# Patient Record
Sex: Female | Born: 1960 | Race: Black or African American | Hispanic: No | Smoking: Never smoker
Health system: Southern US, Community
[De-identification: ages and names within clinical notes are randomized; demographics above are authoritative.]

## PROBLEM LIST (undated history)

## (undated) DIAGNOSIS — G4733 Obstructive sleep apnea (adult) (pediatric): Secondary | ICD-10-CM

## (undated) DIAGNOSIS — E782 Mixed hyperlipidemia: Secondary | ICD-10-CM

## (undated) DIAGNOSIS — I10 Essential (primary) hypertension: Secondary | ICD-10-CM

## (undated) DIAGNOSIS — I5032 Chronic diastolic (congestive) heart failure: Secondary | ICD-10-CM

## (undated) DIAGNOSIS — R0600 Dyspnea, unspecified: Secondary | ICD-10-CM

## (undated) DIAGNOSIS — I160 Hypertensive urgency: Secondary | ICD-10-CM

## (undated) DIAGNOSIS — I509 Heart failure, unspecified: Secondary | ICD-10-CM

## (undated) HISTORY — DX: Hypertensive urgency: I16.0

## (undated) HISTORY — DX: Obstructive sleep apnea (adult) (pediatric): G47.33

## (undated) HISTORY — DX: Mixed hyperlipidemia: E78.2

## (undated) HISTORY — PX: TUBAL LIGATION: SHX77

## (undated) HISTORY — PX: ABDOMINAL HYSTERECTOMY: SHX81

## (undated) HISTORY — DX: Chronic diastolic (congestive) heart failure: I50.32

## (undated) HISTORY — DX: Dyspnea, unspecified: R06.00

## (undated) HISTORY — PX: KNEE ARTHROSCOPY: SUR90

## (undated) HISTORY — PX: CHOLECYSTECTOMY: SHX55

---

## 1996-04-21 HISTORY — PX: ABDOMINAL HYSTERECTOMY: SHX81

## 2006-05-04 ENCOUNTER — Ambulatory Visit: Payer: Self-pay | Admitting: Family Medicine

## 2006-05-06 ENCOUNTER — Ambulatory Visit: Payer: Self-pay | Admitting: *Deleted

## 2006-05-10 ENCOUNTER — Emergency Department (HOSPITAL_COMMUNITY): Admission: EM | Admit: 2006-05-10 | Discharge: 2006-05-10 | Payer: Self-pay | Admitting: Emergency Medicine

## 2006-12-08 ENCOUNTER — Emergency Department (HOSPITAL_COMMUNITY): Admission: EM | Admit: 2006-12-08 | Discharge: 2006-12-08 | Payer: Self-pay | Admitting: Emergency Medicine

## 2006-12-10 ENCOUNTER — Ambulatory Visit: Payer: Self-pay | Admitting: Internal Medicine

## 2007-01-06 ENCOUNTER — Encounter (INDEPENDENT_AMBULATORY_CARE_PROVIDER_SITE_OTHER): Payer: Self-pay | Admitting: *Deleted

## 2007-01-28 ENCOUNTER — Emergency Department (HOSPITAL_COMMUNITY): Admission: EM | Admit: 2007-01-28 | Discharge: 2007-01-28 | Payer: Self-pay | Admitting: Emergency Medicine

## 2007-07-08 ENCOUNTER — Ambulatory Visit: Payer: Self-pay | Admitting: Internal Medicine

## 2007-07-08 LAB — CONVERTED CEMR LAB
Albumin: 4 g/dL (ref 3.5–5.2)
BUN: 13 mg/dL (ref 6–23)
Basophils Relative: 0 % (ref 0–1)
CO2: 23 meq/L (ref 19–32)
Calcium: 9 mg/dL (ref 8.4–10.5)
Chloride: 98 meq/L (ref 96–112)
Cholesterol: 185 mg/dL (ref 0–200)
Creatinine, Ser: 0.81 mg/dL (ref 0.40–1.20)
Glucose, Bld: 83 mg/dL (ref 70–99)
HDL: 46 mg/dL (ref >39)
Hemoglobin: 12.8 g/dL (ref 12.0–15.0)
Lymphocytes Relative: 19 % (ref 12–46)
Lymphs Abs: 1.9 10*3/uL (ref 0.7–4.0)
MCHC: 32 g/dL (ref 30.0–36.0)
Monocytes Absolute: 0.7 10*3/uL (ref 0.1–1.0)
Monocytes Relative: 7 % (ref 3–12)
Neutro Abs: 7 10*3/uL (ref 1.7–7.7)
RBC: 4.79 M/uL (ref 3.87–5.11)
Total CHOL/HDL Ratio: 4
Triglycerides: 74 mg/dL (ref <150)
WBC: 9.8 10*3/uL (ref 4.0–10.5)

## 2009-03-13 ENCOUNTER — Emergency Department (HOSPITAL_COMMUNITY): Admission: EM | Admit: 2009-03-13 | Discharge: 2009-03-13 | Payer: Self-pay | Admitting: Emergency Medicine

## 2010-06-05 ENCOUNTER — Emergency Department (HOSPITAL_COMMUNITY)
Admission: EM | Admit: 2010-06-05 | Discharge: 2010-06-06 | Disposition: A | Discharge status: Home / Self Care | Payer: No Typology Code available for payment source | Attending: Emergency Medicine | Admitting: Emergency Medicine

## 2010-06-05 DIAGNOSIS — I10 Essential (primary) hypertension: Secondary | ICD-10-CM | POA: Insufficient documentation

## 2010-06-05 DIAGNOSIS — S139XXA Sprain of joints and ligaments of unspecified parts of neck, initial encounter: Secondary | ICD-10-CM | POA: Insufficient documentation

## 2010-06-05 DIAGNOSIS — M542 Cervicalgia: Secondary | ICD-10-CM | POA: Insufficient documentation

## 2010-06-05 DIAGNOSIS — Z79899 Other long term (current) drug therapy: Secondary | ICD-10-CM | POA: Insufficient documentation

## 2010-07-28 ENCOUNTER — Emergency Department (HOSPITAL_COMMUNITY): Payer: Self-pay

## 2010-07-28 ENCOUNTER — Emergency Department (HOSPITAL_COMMUNITY)
Admission: EM | Admit: 2010-07-28 | Discharge: 2010-07-29 | Disposition: A | Discharge status: Home / Self Care | Payer: Self-pay | Attending: Emergency Medicine | Admitting: Emergency Medicine

## 2010-07-28 DIAGNOSIS — R51 Headache: Secondary | ICD-10-CM | POA: Insufficient documentation

## 2010-07-28 DIAGNOSIS — R42 Dizziness and giddiness: Secondary | ICD-10-CM | POA: Insufficient documentation

## 2010-07-28 DIAGNOSIS — R079 Chest pain, unspecified: Secondary | ICD-10-CM | POA: Insufficient documentation

## 2010-07-28 DIAGNOSIS — R21 Rash and other nonspecific skin eruption: Secondary | ICD-10-CM | POA: Insufficient documentation

## 2010-07-28 DIAGNOSIS — I10 Essential (primary) hypertension: Secondary | ICD-10-CM | POA: Insufficient documentation

## 2010-07-28 LAB — COMPREHENSIVE METABOLIC PANEL
ALT: 12 U/L (ref 0–35)
AST: 16 U/L (ref 0–37)
Albumin: 3.5 g/dL (ref 3.5–5.2)
Alkaline Phosphatase: 76 U/L (ref 39–117)
CO2: 29 mEq/L (ref 19–32)
Chloride: 99 mEq/L (ref 96–112)
Creatinine, Ser: 0.93 mg/dL (ref 0.4–1.2)
GFR calc Af Amer: >60 mL/min (ref >60)
GFR calc non Af Amer: >60 mL/min (ref >60)
Potassium: 3 mEq/L — ABNORMAL LOW (ref 3.5–5.1)
Sodium: 134 mEq/L — ABNORMAL LOW (ref 135–145)
Total Bilirubin: 0.3 mg/dL (ref 0.3–1.2)

## 2010-07-28 LAB — CBC
HCT: 36.3 % (ref 36.0–46.0)
MCHC: 33.6 g/dL (ref 30.0–36.0)
MCV: 81 fL (ref 78.0–100.0)
Platelets: 288 10*3/uL (ref 150–400)
RDW: 13.7 % (ref 11.5–15.5)
WBC: 13.1 10*3/uL — ABNORMAL HIGH (ref 4.0–10.5)

## 2010-07-28 LAB — DIFFERENTIAL
Eosinophils Absolute: 0.2 10*3/uL (ref 0.0–0.7)
Eosinophils Relative: 2 % (ref 0–5)
Lymphocytes Relative: 25 % (ref 12–46)
Lymphs Abs: 3.3 10*3/uL (ref 0.7–4.0)
Monocytes Absolute: 1 10*3/uL (ref 0.1–1.0)

## 2010-07-28 LAB — URINALYSIS, ROUTINE W REFLEX MICROSCOPIC
Bilirubin Urine: NEGATIVE
Hgb urine dipstick: NEGATIVE
Specific Gravity, Urine: 1.019 (ref 1.005–1.030)
pH: 5.5 (ref 5.0–8.0)

## 2010-07-28 LAB — POCT I-STAT, CHEM 8
Calcium, Ion: 0.99 mmol/L — ABNORMAL LOW (ref 1.12–1.32)
Chloride: 100 mEq/L (ref 96–112)
Glucose, Bld: 99 mg/dL (ref 70–99)
HCT: 38 % (ref 36.0–46.0)
Hemoglobin: 12.9 g/dL (ref 12.0–15.0)
TCO2: 28 mmol/L (ref 0–100)

## 2010-07-28 LAB — APTT: aPTT: 26 seconds (ref 24–37)

## 2010-07-28 LAB — RAPID URINE DRUG SCREEN, HOSP PERFORMED
Amphetamines: NOT DETECTED
Barbiturates: NOT DETECTED
Cocaine: NOT DETECTED
Opiates: NOT DETECTED
Tetrahydrocannabinol: NOT DETECTED

## 2010-07-28 LAB — POCT CARDIAC MARKERS: Troponin i, poc: <0.05 ng/mL (ref 0.00–0.09)

## 2010-10-18 ENCOUNTER — Emergency Department (HOSPITAL_COMMUNITY)
Admission: EM | Admit: 2010-10-18 | Discharge: 2010-10-19 | Disposition: A | Discharge status: Home / Self Care | Payer: Self-pay | Attending: Emergency Medicine | Admitting: Emergency Medicine

## 2010-10-18 DIAGNOSIS — I10 Essential (primary) hypertension: Secondary | ICD-10-CM | POA: Insufficient documentation

## 2010-10-18 DIAGNOSIS — T63391A Toxic effect of venom of other spider, accidental (unintentional), initial encounter: Secondary | ICD-10-CM | POA: Insufficient documentation

## 2010-10-18 DIAGNOSIS — T6391XA Toxic effect of contact with unspecified venomous animal, accidental (unintentional), initial encounter: Secondary | ICD-10-CM | POA: Insufficient documentation

## 2010-10-18 DIAGNOSIS — L02419 Cutaneous abscess of limb, unspecified: Secondary | ICD-10-CM | POA: Insufficient documentation

## 2010-10-18 DIAGNOSIS — M7989 Other specified soft tissue disorders: Secondary | ICD-10-CM | POA: Insufficient documentation

## 2010-10-18 DIAGNOSIS — M79609 Pain in unspecified limb: Secondary | ICD-10-CM | POA: Insufficient documentation

## 2010-10-18 DIAGNOSIS — Z79899 Other long term (current) drug therapy: Secondary | ICD-10-CM | POA: Insufficient documentation

## 2011-01-31 LAB — I-STAT 8, (EC8 V) (CONVERTED LAB)
BUN: 7
Bicarbonate: 29.2 — ABNORMAL HIGH
Glucose, Bld: 101 — ABNORMAL HIGH
Hemoglobin: 13.6
Sodium: 138
TCO2: 30
pH, Ven: 7.432 — ABNORMAL HIGH

## 2011-01-31 LAB — POCT CARDIAC MARKERS
Myoglobin, poc: 56.7
Operator id: 285841
Troponin i, poc: <0.05

## 2011-01-31 LAB — POCT I-STAT CREATININE: Creatinine, Ser: 1.1

## 2011-03-28 ENCOUNTER — Encounter: Payer: Self-pay | Admitting: *Deleted

## 2011-03-28 ENCOUNTER — Other Ambulatory Visit: Payer: Self-pay

## 2011-03-28 ENCOUNTER — Emergency Department (HOSPITAL_COMMUNITY)
Admission: EM | Admit: 2011-03-28 | Discharge: 2011-03-28 | Disposition: A | Discharge status: Home / Self Care | Payer: Self-pay | Attending: Emergency Medicine | Admitting: Emergency Medicine

## 2011-03-28 ENCOUNTER — Emergency Department (HOSPITAL_COMMUNITY): Payer: Self-pay

## 2011-03-28 DIAGNOSIS — J3489 Other specified disorders of nose and nasal sinuses: Secondary | ICD-10-CM | POA: Insufficient documentation

## 2011-03-28 DIAGNOSIS — B9789 Other viral agents as the cause of diseases classified elsewhere: Secondary | ICD-10-CM | POA: Insufficient documentation

## 2011-03-28 DIAGNOSIS — R5381 Other malaise: Secondary | ICD-10-CM | POA: Insufficient documentation

## 2011-03-28 DIAGNOSIS — IMO0001 Reserved for inherently not codable concepts without codable children: Secondary | ICD-10-CM | POA: Insufficient documentation

## 2011-03-28 DIAGNOSIS — R509 Fever, unspecified: Secondary | ICD-10-CM | POA: Insufficient documentation

## 2011-03-28 DIAGNOSIS — R059 Cough, unspecified: Secondary | ICD-10-CM | POA: Insufficient documentation

## 2011-03-28 DIAGNOSIS — B349 Viral infection, unspecified: Secondary | ICD-10-CM

## 2011-03-28 DIAGNOSIS — R197 Diarrhea, unspecified: Secondary | ICD-10-CM | POA: Insufficient documentation

## 2011-03-28 DIAGNOSIS — J029 Acute pharyngitis, unspecified: Secondary | ICD-10-CM | POA: Insufficient documentation

## 2011-03-28 DIAGNOSIS — I10 Essential (primary) hypertension: Secondary | ICD-10-CM | POA: Insufficient documentation

## 2011-03-28 DIAGNOSIS — R112 Nausea with vomiting, unspecified: Secondary | ICD-10-CM | POA: Insufficient documentation

## 2011-03-28 DIAGNOSIS — R6889 Other general symptoms and signs: Secondary | ICD-10-CM | POA: Insufficient documentation

## 2011-03-28 DIAGNOSIS — R05 Cough: Secondary | ICD-10-CM | POA: Insufficient documentation

## 2011-03-28 HISTORY — DX: Essential (primary) hypertension: I10

## 2011-03-28 LAB — URINALYSIS, DIPSTICK ONLY
Glucose, UA: NEGATIVE mg/dL
Hgb urine dipstick: NEGATIVE
Protein, ur: NEGATIVE mg/dL
Specific Gravity, Urine: 1.015 (ref 1.005–1.030)
pH: 7 (ref 5.0–8.0)

## 2011-03-28 LAB — POCT I-STAT, CHEM 8
Chloride: 103 mEq/L (ref 96–112)
Glucose, Bld: 95 mg/dL (ref 70–99)
HCT: 42 % (ref 36.0–46.0)
Potassium: 3.5 mEq/L (ref 3.5–5.1)
Sodium: 139 mEq/L (ref 135–145)

## 2011-03-28 LAB — RAPID STREP SCREEN (MED CTR MEBANE ONLY): Streptococcus, Group A Screen (Direct): NEGATIVE

## 2011-03-28 MED ORDER — IBUPROFEN 200 MG PO TABS
400.0000 mg | ORAL_TABLET | Freq: Once | ORAL | Status: AC
  Administered 2011-03-28: 400 mg via ORAL
  Filled 2011-03-28: qty 2

## 2011-03-28 MED ORDER — HYDROCHLOROTHIAZIDE 12.5 MG PO CAPS
25.0000 mg | ORAL_CAPSULE | Freq: Once | ORAL | Status: AC
  Administered 2011-03-28: 25 mg via ORAL
  Filled 2011-03-28 (×2): qty 2

## 2011-03-28 MED ORDER — ONDANSETRON HCL 4 MG/2ML IJ SOLN
4.0000 mg | Freq: Once | INTRAMUSCULAR | Status: AC
  Administered 2011-03-28: 4 mg via INTRAVENOUS
  Filled 2011-03-28: qty 2

## 2011-03-28 MED ORDER — SODIUM CHLORIDE 0.9 % IV BOLUS (SEPSIS)
500.0000 mL | Freq: Once | INTRAVENOUS | Status: AC
  Administered 2011-03-28: 500 mL via INTRAVENOUS

## 2011-03-28 MED ORDER — ACETAMINOPHEN 325 MG PO TABS
ORAL_TABLET | ORAL | Status: AC
  Filled 2011-03-28: qty 3

## 2011-03-28 MED ORDER — ACETAMINOPHEN 500 MG PO TABS
1000.0000 mg | ORAL_TABLET | Freq: Once | ORAL | Status: AC
  Administered 2011-03-28: 975 mg via ORAL

## 2011-03-28 MED ORDER — HYDROCHLOROTHIAZIDE 25 MG PO TABS: 25.0000 mg | ORAL_TABLET | Freq: Every day | ORAL | Status: DC

## 2011-03-28 MED ORDER — SODIUM CHLORIDE 0.9 % IV SOLN: INTRAVENOUS | Status: DC

## 2011-03-28 NOTE — ED Notes (Signed)
EKG was performed in triage by Raynelle Fanning, RN and Clarene Duke, MD has EKGs

## 2011-03-28 NOTE — ED Notes (Signed)
Patient reports onset of fever, chills, and diarrhea on yesterday.  She states today she is aching all over

## 2011-03-28 NOTE — ED Notes (Signed)
HCTZ still needed from pharmacy. They are aware and 'will look at it'.

## 2011-03-28 NOTE — ED Notes (Signed)
Fever, nausea, body aches and cough that started yesterday.  Pt rates pain 10/10.

## 2011-03-28 NOTE — ED Provider Notes (Signed)
History     CSN: [id number] Arrival date & time: 03/28/2011 12:41 PM    Chief Complaint  Patient presents with  . Fever  . Chills  . Diarrhea  . Emesis    HPI Pt was seen at 1540.  Per pt, c/o gradual onset and persistence of constant runny/stuffy nose, sinus congestion, sore throat, cough, nausea, several "loose" stools, and generalized body aches/fatigue since yesterday.  +several others in household with same symptoms.  Denies fevers, no SOB, no CP/palpitations, no abd pain, no vomiting, no black or blood in stools, no rash.    Past Medical History  Diagnosis Date  . Hypertension     Past Surgical History  Procedure Date  . Cholecystectomy   . Abdominal hysterectomy   . Tubal ligation    History  Substance Use Topics  . Smoking status: Never Smoker   . Smokeless tobacco: Not on file  . Alcohol Use: No   Review of Systems ROS: Statement: All systems negative except as marked or noted in the HPI; Constitutional: Negative for fever and chills. +generalized body aches/fatigue ; ; Eyes: Negative for eye pain, redness and discharge. ; ; ENMT: Negative for ear pain, hoarseness, +nasal congestion, sinus pressure and sore throat. ; ; Cardiovascular: Negative for chest pain, palpitations, diaphoresis, dyspnea and peripheral edema. ; ; Respiratory: +cough. Negative for wheezing and stridor. ; ; Gastrointestinal: +nausea, diarrhea.  Negative for vomiting, abdominal pain, blood in stool, hematemesis, jaundice and rectal bleeding. . ; ; Genitourinary: Negative for dysuria, flank pain and hematuria. ; ; Musculoskeletal: Negative for back pain and neck pain. Negative for swelling and trauma.; ; Skin: Negative for pruritus, rash, abrasions, blisters, bruising and skin lesion.; ; Neuro: Negative for headache, lightheadedness and neck stiffness. Negative for altered level of consciousness , altered mental status, extremity weakness, paresthesias, involuntary movement, seizure and syncope.      Allergies  Review of patient's allergies indicates no known allergies.  Home Medications   Current Outpatient Rx  Name Route Sig Dispense Refill  . GUAIFENESIN ER 600 MG PO TB12 Oral Take 600 mg by mouth every 6 (six) hours as needed. For cough/congestion       BP 182/103  Pulse 92  Temp(Src) 98.9 F (37.2 C) (Oral)  Resp 18  SpO2 97%  Physical Exam 1545: Physical examination:  Nursing notes reviewed; Vital signs and O2 SAT reviewed;  Constitutional: Well developed, Well nourished, Well hydrated, In no acute distress; Head:  Normocephalic, atraumatic; Eyes: EOMI, PERRL, No scleral icterus; ENMT: Mouth and pharynx normal, Mucous membranes moist; Neck: Supple, Full range of motion, No lymphadenopathy; Cardiovascular: Regular rate and rhythm, No murmur, rub, or gallop; Respiratory: Breath sounds clear & equal bilaterally, No rales, rhonchi, wheezes, or rub, Normal respiratory effort/excursion; Chest: Nontender, Movement normal; Abdomen: Soft, Nontender, Nondistended, Normal bowel sounds; Genitourinary: No CVA tenderness; Extremities: Pulses normal, No tenderness, No edema, No calf edema or asymmetry.; Neuro: AA&Ox3, Major CN grossly intact. No facial droop, speech clear.  No gross focal motor or sensory deficits in extremities.; Skin: Color normal, Warm, Dry, no rash, no petechiae.     ED Course  Procedures   1545:  Pt also states she has been out of her anti-HTN meds "for a while now."  Inclu atenolol, lisinopril, HCTZ.  States she was planning on calling [facility] this week to re-establish with a PMD.  Long d/w pt re: importance of HTN management and establishing with PMD.  Verb understanding.  Will rx HCTZ, will give  1st dose while in ED.    MDM  MDM Reviewed: nursing note and vitals Reviewed previous: ECG Interpretation: ECG, labs and x-ray    Date: 03/28/2011  Rate: 92  Rhythm: normal sinus rhythm  QRS Axis: normal  Intervals: normal  ST/T Wave abnormalities:  nonspecific T wave changes  Conduction Disutrbances:none  Narrative Interpretation:   Old EKG Reviewed: unchanged; NS T-wave changes on today's EKG improved from previous EKG dated 09/27/2010.  Results for orders placed during the hospital encounter of 03/28/11  POCT I-STAT, CHEM 8      Component Value Range   Sodium 139  135 - 145 (mEq/L)   Potassium 3.5  3.5 - 5.1 (mEq/L)   Chloride 103  96 - 112 (mEq/L)   BUN 4 (*) 6 - 23 (mg/dL)   Creatinine, Ser 7.82  0.50 - 1.10 (mg/dL)   Glucose, Bld 95  70 - 99 (mg/dL)   Calcium, Ion 9.56 (*) 1.12 - 1.32 (mmol/L)   TCO2 24  0 - 100 (mmol/L)   Hemoglobin 14.3  12.0 - 15.0 (g/dL)   HCT 21.3  08.6 - 57.8 (%)  URINALYSIS, DIPSTICK ONLY      Component Value Range   Specific Gravity, Urine 1.015  1.005 - 1.030    pH 7.0  5.0 - 8.0    Glucose, UA NEGATIVE  NEGATIVE (mg/dL)   Hgb urine dipstick NEGATIVE  NEGATIVE    Bilirubin Urine NEGATIVE  NEGATIVE    Ketones, ur NEGATIVE  NEGATIVE (mg/dL)   Protein, ur NEGATIVE  NEGATIVE (mg/dL)   Urobilinogen, UA 0.2  0.0 - 1.0 (mg/dL)   Nitrite NEGATIVE  NEGATIVE    Leukocytes, UA NEGATIVE  NEGATIVE   RAPID STREP SCREEN      Component Value Range   Streptococcus, Group A Screen (Direct) NEGATIVE  NEGATIVE    Dg Chest 2 View  03/28/2011  *RADIOLOGY REPORT*  Clinical Data: Rule out infiltrate.  Nausea, vomiting with fever, congestion, coughing, body aches and weakness.  Hypertension. Nonsmoker  CHEST - 2 VIEW  Comparison: 07/28/2010  Findings: Heart and mediastinal contours are within normal limits. Ectasia of the thoracic aorta is again identified and is stable in degree.  This would correlate with the history of hypertension.  The lung fields are clear with no signs of focal infiltrate or congestive failure.  No pleural fluid or peribronchial cuffing is noted.  Bony structures appear intact.  IMPRESSION: Stable cardiopulmonary appearance with no worrisome focal or acute abnormality seen.  Original Report  Authenticated By: Bertha Stakes, M.D.    6:13 PM:  Feels "better" after tylenol and ibuprofen.  No fevers while in ED.  No focal source of infection; likely viral illness at this time.  EKG, Cr, UA normal.  Denies CP or focal motor weakness.  Wants to go home now.  Dx testing d/w pt.  Questions answered.  Verb understanding, agreeable to d/c home with outpt f/u.            Ziaire Bieser Allison Quarry, DO 03/30/11 0231

## 2011-03-28 NOTE — ED Provider Notes (Signed)
  Physical Exam  BP 198/123  Pulse 102  Temp(Src) 98.8 F (37.1 C) (Oral)  Resp 20  SpO2 96%  Physical Exam Uncomfortable appearing. Hypertensive.  ED Course  Procedures  MDM Hypertensive with medical noncompliance for a month. She comes in because of diarrhea and myalgias. She is sick contact with a grandson. We'll get some basic labs IV fluids and Zofran.      Juliet Rude. Rubin Payor, MD 03/28/11 1423

## 2014-03-07 ENCOUNTER — Emergency Department (HOSPITAL_COMMUNITY)
Admission: EM | Admit: 2014-03-07 | Discharge: 2014-03-07 | Disposition: A | Discharge status: Home / Self Care | Payer: No Typology Code available for payment source | Attending: Emergency Medicine | Admitting: Emergency Medicine

## 2014-03-07 ENCOUNTER — Encounter (HOSPITAL_COMMUNITY): Payer: Self-pay

## 2014-03-07 DIAGNOSIS — M25561 Pain in right knee: Secondary | ICD-10-CM | POA: Insufficient documentation

## 2014-03-07 DIAGNOSIS — R609 Edema, unspecified: Secondary | ICD-10-CM

## 2014-03-07 DIAGNOSIS — I10 Essential (primary) hypertension: Secondary | ICD-10-CM | POA: Insufficient documentation

## 2014-03-07 DIAGNOSIS — M1990 Unspecified osteoarthritis, unspecified site: Secondary | ICD-10-CM | POA: Insufficient documentation

## 2014-03-07 LAB — BASIC METABOLIC PANEL
Anion gap: 12 (ref 5–15)
BUN: 12 mg/dL (ref 6–23)
CALCIUM: 8.5 mg/dL (ref 8.4–10.5)
CO2: 27 mEq/L (ref 19–32)
CREATININE: 0.88 mg/dL (ref 0.50–1.10)
Chloride: 101 mEq/L (ref 96–112)
GFR calc Af Amer: 87 mL/min — ABNORMAL LOW (ref >90)
GFR, EST NON AFRICAN AMERICAN: 75 mL/min — AB (ref >90)
GLUCOSE: 88 mg/dL (ref 70–99)
Potassium: 3.7 mEq/L (ref 3.7–5.3)
Sodium: 140 mEq/L (ref 137–147)

## 2014-03-07 MED ORDER — LISINOPRIL-HYDROCHLOROTHIAZIDE 20-12.5 MG PO TABS: 1.0000 | ORAL_TABLET | Freq: Every day | ORAL | Status: DC

## 2014-03-07 MED ORDER — MELOXICAM 15 MG PO TABS: 15.0000 mg | ORAL_TABLET | Freq: Every day | ORAL | Status: DC

## 2014-03-07 NOTE — ED Notes (Signed)
states she took motrin 400 mg this am

## 2014-03-07 NOTE — ED Notes (Signed)
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 C/o pain and swelling to right leg and knee today. Increased pain

## 2014-03-07 NOTE — Discharge Instructions (Signed)
Arthritis, Nonspecific Arthritis is pain, redness, warmth, or puffiness (inflammation) of a joint. The joint may be stiff or hurt when you move it. One or more joints may be affected. There are many types of arthritis. Your doctor may not know what type you have right away. The most common cause of arthritis is wear and tear on the joint (osteoarthritis). HOME CARE   Only take medicine as told by your doctor.  Rest the joint as much as possible.  Raise (elevate) your joint if it is puffy.  Use crutches if the painful joint is in your leg.  Drink enough fluids to keep your pee (urine) clear or pale yellow.  Follow your doctor's diet instructions.  Use cold packs for very bad joint pain for 10 to 15 minutes every hour. Ask your doctor if it is okay for you to use hot packs.  Exercise as told by your doctor.  Take a warm shower if you have stiffness in the morning.  Move your sore joints throughout the day. GET HELP RIGHT AWAY IF:   You have a fever.  You have very bad joint pain, puffiness, or redness.  You have many joints that are painful and puffy.  You are not getting better with treatment.  You have very bad back pain or leg weakness.  You cannot control when you poop (bowel movement) or pee (urinate).  You do not feel better in 24 hours or are getting worse.  You are having side effects from your medicine. MAKE SURE YOU:   Understand these instructions.  Will watch your condition.  Will get help right away if you are not doing well or get worse. Document Released: 07/02/2009 Document Revised: 10/07/2011 Document Reviewed: 07/02/2009 [facility]ExitCare Patient Information 2015 [place], [place]. This information is not intended to replace advice given to you by your health care provider. Make sure you discuss any questions you have with your health care provider.  Edema Edema is an abnormal buildup of fluids in your bodytissues. Edema is somewhatdependent on gravity to  pull the fluid to the lowest place in your body. That makes the condition more common in the legs and thighs (lower extremities). Painless swelling of the feet and ankles is common and becomes more likely as you get older. It is also common in looser tissues, like around your eyes.  When the affected area is squeezed, the fluid may move out of that spot and leave a dent for a few moments. This dent is called pitting.  CAUSES  There are many possible causes of edema. Eating too much salt and being on your feet or sitting for a long time can cause edema in your legs and ankles. Hot weather may make edema worse. Common medical causes of edema include:  Heart failure.  Liver disease.  Kidney disease.  Weak blood vessels in your legs.  Cancer.  An injury.  Pregnancy.  Some medications.  Obesity. SYMPTOMS  Edema is usually painless.Your skin may look swollen or shiny.  DIAGNOSIS  Your health care provider may be able to diagnose edema by asking about your medical history and doing a physical exam. You may need to have tests such as X-rays, an electrocardiogram, or blood tests to check for medical conditions that may cause edema.  TREATMENT  Edema treatment depends on the cause. If you have heart, liver, or kidney disease, you need the treatment appropriate for these conditions. General treatment may include:  Elevation of the affected body part above the  level of your heart.  Compression of the affected body part. Pressure from elastic bandages or support stockings squeezes the tissues and forces fluid back into the blood vessels. This keeps fluid from entering the tissues.  Restriction of fluid and salt intake.  Use of a water pill (diuretic). These medications are appropriate only for some types of edema. They pull fluid out of your body and make you urinate more often. This gets rid of fluid and reduces swelling, but diuretics can have side effects. Only use diuretics as directed by  your health care provider. HOME CARE INSTRUCTIONS   Keep the affected body part above the level of your heart when you are lying down.   Do not sit still or stand for prolonged periods.   Do not put anything directly under your knees when lying down.  Do not wear constricting clothing or garters on your upper legs.   Exercise your legs to work the fluid back into your blood vessels. This may help the swelling go down.   Wear elastic bandages or support stockings to reduce ankle swelling as directed by your health care provider.   Eat a low-salt diet to reduce fluid if your health care provider recommends it.   Only take medicines as directed by your health care provider. SEEK MEDICAL CARE IF:   Your edema is not responding to treatment.  You have heart, liver, or kidney disease and notice symptoms of edema.  You have edema in your legs that does not improve after elevating them.   You have sudden and unexplained weight gain. SEEK IMMEDIATE MEDICAL CARE IF:   You develop shortness of breath or chest pain.   You cannot breathe when you lie down.  You develop pain, redness, or warmth in the swollen areas.   You have heart, liver, or kidney disease and suddenly get edema.  You have a fever and your symptoms suddenly get worse. MAKE SURE YOU:   Understand these instructions.  Will watch your condition.  Will get help right away if you are not doing well or get worse. Document Released: 04/07/2005 Document Revised: 08/22/2013 Document Reviewed: 01/28/2013 [facility] Patient Information 2015 [place], [place]. This information is not intended to replace advice given to you by your health care provider. Make sure you discuss any questions you have with your health care provider.

## 2014-03-07 NOTE — ED Notes (Signed)
MD at bedside. 

## 2014-03-07 NOTE — ED Notes (Signed)
Pt reports she began having LLE swelling followed by R knee pain and RLE swelling.  Pt is alert and oriented.  No redness noted to LE.  LLE > RLE swelling.

## 2014-03-07 NOTE — ED Provider Notes (Signed)
CSN: [id number]     Arrival date & time 03/07/14  0901 History   First MD Initiated Contact with Patient 03/07/14 [phone]     Chief Complaint  Patient presents with  . Leg Swelling  . Knee Pain      HPI  She presents evaluation of bilateral lower extremity swelling, and right knee pain. She had arthroscopic surgery on her knee several years ago. Pain is persisted. She states she has been a great of within the last several years as well. No cough dyspnea or CHF symptoms. History of lower extremity edema and hypertension. Was previously on Zestoretic. Has been off of his for several months.  Past Medical History  Diagnosis Date  . Hypertension    Past Surgical History  Procedure Laterality Date  . Cholecystectomy    . Abdominal hysterectomy    . Tubal ligation    . Knee arthroscopy     No family history on file. History  Substance Use Topics  . Smoking status: Never Smoker   . Smokeless tobacco: Not on file  . Alcohol Use: No   OB History    No data available     Review of Systems  Constitutional: Negative for fever, chills, diaphoresis, appetite change and fatigue.  HENT: Negative for congestion, mouth sores, sinus pressure, sore throat and trouble swallowing.   Eyes: Negative for visual disturbance.  Respiratory: Negative for cough, chest tightness, shortness of breath and wheezing.   Cardiovascular: Positive for leg swelling. Negative for chest pain.  Gastrointestinal: Negative for nausea, vomiting, abdominal pain, diarrhea and abdominal distention.  Endocrine: Negative for polydipsia, polyphagia and polyuria.  Genitourinary: Negative for dysuria, frequency and hematuria.  Musculoskeletal: Positive for arthralgias. Negative for gait problem.  Skin: Negative for color change, pallor and rash.  Neurological: Negative for dizziness, syncope, light-headedness and headaches.  Hematological: Does not bruise/bleed easily.  Psychiatric/Behavioral: Negative for behavioral  problems and confusion.      Allergies  Percocet  Home Medications   Prior to Admission medications   Medication Sig Start Date End Date Taking? Authorizing Provider  acetaminophen (TYLENOL) 500 MG tablet Take 1,000 mg by mouth every 8 (eight) hours as needed (pain).   Yes Historical Provider, MD  ibuprofen (ADVIL,MOTRIN) 200 MG tablet Take 400 mg by mouth every 6 (six) hours as needed (pain).   Yes Historical Provider, MD  lisinopril-hydrochlorothiazide (PRINZIDE) 20-12.5 MG per tablet Take 1 tablet by mouth daily. 03/07/14   Rolland PorterMark Adib Wahba, MD  meloxicam (MOBIC) 15 MG tablet Take 1 tablet (15 mg total) by mouth daily. 03/07/14   Rolland PorterMark Shernell Saldierna, MD   BP 175/98 mmHg  Pulse 83  Resp 16  Ht 5\' 2"  (1.575 m)  Wt 270 lb (122.471 kg)  BMI 49.37 kg/m2  SpO2 97% Physical Exam  Constitutional: She is oriented to person, place, and time. She appears well-developed and well-nourished. No distress.  HENT:  Head: Normocephalic.  Nasal congestion.  Normal pharynx.  Normal TMs  Eyes: Conjunctivae are normal. Pupils are equal, round, and reactive to light. No scleral icterus.  Neck: Normal range of motion. Neck supple. No thyromegaly present.  Cardiovascular: Normal rate and regular rhythm.  Exam reveals no gallop and no friction rub.   No murmur heard. Pulmonary/Chest: Effort normal and breath sounds normal. No respiratory distress. She has no wheezes. She has no rales.  Abdominal: Soft. Bowel sounds are normal. She exhibits no distension. There is no tenderness. There is no rebound.  Musculoskeletal: Normal range of motion.  Legs: He has trace symmetric bilateral lower extremity edema.  Neurological: She is alert and oriented to person, place, and time.  Skin: Skin is warm and dry. No rash noted.  Psychiatric: She has a normal mood and affect. Her behavior is normal.    ED Course  Procedures (including critical care time) Labs Review Labs Reviewed  BASIC METABOLIC PANEL - Abnormal;  Notable for the following:    GFR calc non Af Amer 75 (*)    GFR calc Af Amer 87 (*)    All other components within normal limits    Imaging Review No results found.   EKG Interpretation None      MDM   Final diagnoses:  Arthritis  Dependent edema  Essential hypertension    Renal function preserved. . Chronic right knee pain and swelling. Recurrence of lower extremity edema after discontinuing diuretic. Discussed weight control and weight loss, diet, salt resection, and exercise.  Prescription for 30 days of Zestoretic, and mobility. Primary care follow-up.    Rolland PorterMark Hyden Soley, MD 03/07/14 [phone]

## 2014-03-11 ENCOUNTER — Emergency Department (HOSPITAL_COMMUNITY): Payer: Self-pay

## 2014-03-11 ENCOUNTER — Encounter (HOSPITAL_COMMUNITY): Payer: Self-pay | Admitting: Emergency Medicine

## 2014-03-11 ENCOUNTER — Emergency Department (HOSPITAL_COMMUNITY)
Admission: EM | Admit: 2014-03-11 | Discharge: 2014-03-12 | Disposition: A | Discharge status: Home / Self Care | Payer: Self-pay | Attending: Emergency Medicine | Admitting: Emergency Medicine

## 2014-03-11 DIAGNOSIS — R1110 Vomiting, unspecified: Secondary | ICD-10-CM

## 2014-03-11 DIAGNOSIS — R000 Tachycardia, unspecified: Secondary | ICD-10-CM | POA: Insufficient documentation

## 2014-03-11 DIAGNOSIS — I10 Essential (primary) hypertension: Secondary | ICD-10-CM | POA: Insufficient documentation

## 2014-03-11 DIAGNOSIS — R197 Diarrhea, unspecified: Secondary | ICD-10-CM | POA: Insufficient documentation

## 2014-03-11 DIAGNOSIS — Z79899 Other long term (current) drug therapy: Secondary | ICD-10-CM | POA: Insufficient documentation

## 2014-03-11 LAB — COMPREHENSIVE METABOLIC PANEL
ALBUMIN: 4.1 g/dL (ref 3.5–5.2)
ALK PHOS: 101 U/L (ref 39–117)
ALT: 35 U/L (ref 0–35)
ANION GAP: 15 (ref 5–15)
AST: 24 U/L (ref 0–37)
BILIRUBIN TOTAL: 0.8 mg/dL (ref 0.3–1.2)
BUN: 11 mg/dL (ref 6–23)
CHLORIDE: 96 meq/L (ref 96–112)
CO2: 26 meq/L (ref 19–32)
CREATININE: 0.82 mg/dL (ref 0.50–1.10)
Calcium: 9.3 mg/dL (ref 8.4–10.5)
GFR calc Af Amer: >90 mL/min (ref >90)
GFR, EST NON AFRICAN AMERICAN: 82 mL/min — AB (ref >90)
Glucose, Bld: 129 mg/dL — ABNORMAL HIGH (ref 70–99)
POTASSIUM: 3.4 meq/L — AB (ref 3.7–5.3)
Sodium: 137 mEq/L (ref 137–147)
Total Protein: 9.1 g/dL — ABNORMAL HIGH (ref 6.0–8.3)

## 2014-03-11 LAB — CBC WITH DIFFERENTIAL/PLATELET
BASOS PCT: 0 % (ref 0–1)
Basophils Absolute: 0 10*3/uL (ref 0.0–0.1)
Eosinophils Absolute: 0 10*3/uL (ref 0.0–0.7)
Eosinophils Relative: 0 % (ref 0–5)
HCT: 39.3 % (ref 36.0–46.0)
Hemoglobin: 12.9 g/dL (ref 12.0–15.0)
LYMPHS PCT: 4 % — AB (ref 12–46)
Lymphs Abs: 0.6 10*3/uL — ABNORMAL LOW (ref 0.7–4.0)
MCH: 26 pg (ref 26.0–34.0)
MCHC: 32.8 g/dL (ref 30.0–36.0)
MCV: 79.2 fL (ref 78.0–100.0)
MONO ABS: 0.7 10*3/uL (ref 0.1–1.0)
MONOS PCT: 4 % (ref 3–12)
NEUTROS ABS: 14.2 10*3/uL — AB (ref 1.7–7.7)
Neutrophils Relative %: 92 % — ABNORMAL HIGH (ref 43–77)
PLATELETS: 314 10*3/uL (ref 150–400)
RBC: 4.96 MIL/uL (ref 3.87–5.11)
RDW: 13.8 % (ref 11.5–15.5)
WBC: 15.4 10*3/uL — ABNORMAL HIGH (ref 4.0–10.5)

## 2014-03-11 LAB — LIPASE, BLOOD: Lipase: 17 U/L (ref 11–59)

## 2014-03-11 LAB — I-STAT TROPONIN, ED: Troponin i, poc: 0 ng/mL (ref 0.00–0.08)

## 2014-03-11 MED ORDER — METOCLOPRAMIDE HCL 5 MG/ML IJ SOLN
10.0000 mg | Freq: Once | INTRAMUSCULAR | Status: AC
  Administered 2014-03-11: 10 mg via INTRAVENOUS
  Filled 2014-03-11: qty 2

## 2014-03-11 MED ORDER — SODIUM CHLORIDE 0.9 % IV BOLUS (SEPSIS)
1000.0000 mL | Freq: Once | INTRAVENOUS | Status: AC
  Administered 2014-03-11: 1000 mL via INTRAVENOUS

## 2014-03-11 MED ORDER — ACETAMINOPHEN 500 MG PO TABS
1000.0000 mg | ORAL_TABLET | Freq: Once | ORAL | Status: AC
  Administered 2014-03-11: 1000 mg via ORAL
  Filled 2014-03-11: qty 2

## 2014-03-11 MED ORDER — GI COCKTAIL ~~LOC~~
30.0000 mL | Freq: Once | ORAL | Status: AC
  Administered 2014-03-11: 30 mL via ORAL
  Filled 2014-03-11: qty 30

## 2014-03-11 MED ORDER — ONDANSETRON 4 MG PO TBDP
8.0000 mg | ORAL_TABLET | Freq: Once | ORAL | Status: AC
  Administered 2014-03-11: 8 mg via ORAL
  Filled 2014-03-11: qty 2

## 2014-03-11 MED ORDER — ONDANSETRON HCL 4 MG/2ML IJ SOLN
4.0000 mg | Freq: Once | INTRAMUSCULAR | Status: AC
  Administered 2014-03-11: 4 mg via INTRAVENOUS

## 2014-03-11 NOTE — ED Provider Notes (Signed)
CSN: [id number]     Arrival date & time 03/11/14  1703 History   First MD Initiated Contact with Patient 03/11/14 1848     Chief Complaint  Patient presents with  . Emesis  . Diarrhea     (Consider location/radiation/quality/duration/timing/severity/associated sxs/prior Treatment) Patient is a 53 y.o. female presenting with vomiting. The history is provided by the patient.  Emesis Severity:  Moderate Timing:  Constant Progression:  Unchanged Chronicity:  New Recent urination:  Normal Relieved by:  Nothing Worsened by:  Nothing tried Associated symptoms: abdominal pain (epigastric) and diarrhea   Associated symptoms: no chills, no cough, no fever, no sore throat and no URI     Past Medical History  Diagnosis Date  . Hypertension    Past Surgical History  Procedure Laterality Date  . Cholecystectomy    . Abdominal hysterectomy    . Tubal ligation    . Knee arthroscopy     History reviewed. No pertinent family history. History  Substance Use Topics  . Smoking status: Never Smoker   . Smokeless tobacco: Not on file  . Alcohol Use: No   OB History    No data available     Review of Systems  Constitutional: Negative for fever and chills.  HENT: Negative for sore throat.   Respiratory: Negative for shortness of breath.   Cardiovascular: Positive for chest pain (pressure, in epigastrum).  Gastrointestinal: Positive for vomiting, abdominal pain (epigastric) and diarrhea.  All other systems reviewed and are negative.     Allergies  Percocet  Home Medications   Prior to Admission medications   Medication Sig Start Date End Date Taking? Authorizing Provider  acetaminophen (TYLENOL) 500 MG tablet Take 1,000 mg by mouth every 8 (eight) hours as needed (pain).    Historical Provider, MD  ibuprofen (ADVIL,MOTRIN) 200 MG tablet Take 400 mg by mouth every 6 (six) hours as needed (pain).    Historical Provider, MD  lisinopril-hydrochlorothiazide (PRINZIDE) 20-12.5 MG  per tablet Take 1 tablet by mouth daily. 03/07/14   Rolland PorterMark James, MD  meloxicam (MOBIC) 15 MG tablet Take 1 tablet (15 mg total) by mouth daily. 03/07/14   Rolland PorterMark James, MD   BP 176/120 mmHg  Pulse 112  Temp(Src) 98.6 F (37 C) (Oral)  Resp 18  SpO2 97% Physical Exam  Constitutional: She is oriented to person, place, and time. She appears well-developed and well-nourished. No distress.  HENT:  Head: Normocephalic and atraumatic.  Mouth/Throat: Oropharynx is clear and moist.  Eyes: EOM are normal. Pupils are equal, round, and reactive to light.  Neck: Normal range of motion. Neck supple.  Cardiovascular: Regular rhythm.  Tachycardia present.  Exam reveals no friction rub.   No murmur heard. Pulmonary/Chest: Effort normal and breath sounds normal. No respiratory distress. She has no wheezes. She has no rales.  Abdominal: Soft. She exhibits no distension. There is tenderness (epigastric). There is no rebound.  Musculoskeletal: Normal range of motion. She exhibits no edema.  Neurological: She is alert and oriented to person, place, and time.  Skin: No rash noted. She is not diaphoretic.  Nursing note and vitals reviewed.   ED Course  Procedures (including critical care time) Labs Review Labs Reviewed  CBC WITH DIFFERENTIAL - Abnormal; Notable for the following:    WBC 15.4 (*)    Neutrophils Relative % 92 (*)    Neutro Abs 14.2 (*)    Lymphocytes Relative 4 (*)    Lymphs Abs 0.6 (*)    All  other components within normal limits  COMPREHENSIVE METABOLIC PANEL - Abnormal; Notable for the following:    Potassium 3.4 (*)    Glucose, Bld 129 (*)    Total Protein 9.1 (*)    GFR calc non Af Amer 82 (*)    All other components within normal limits  LIPASE, BLOOD  URINALYSIS, ROUTINE W REFLEX MICROSCOPIC  I-STAT TROPOININ, ED    Imaging Review Dg Chest 2 View  03/11/2014   CLINICAL DATA:  Chest tightness and pain with emesis. History of hypertension. Initial encounter.  EXAM: CHEST   2 VIEW  COMPARISON:  Radiographs 07/28/2010 and 03/28/2011.  FINDINGS: The heart size and mediastinal contours are stable. There is mild aortic tortuosity. The lungs are clear. There is no pleural effusion or pneumothorax. Degenerative changes throughout the spine are stable. No acute osseous findings are evident.  IMPRESSION: Stable chest.  No acute cardiopulmonary process.   Electronically Signed   By: Roxy HorsemanBill  Veazey M.D.   On: 03/11/2014 20:57     EKG Interpretation None      MDM   Final diagnoses:  Vomiting and diarrhea    31F here with acute onset N/V/D that began about 5 hours ago. CP started about 1 hour afterwards. Epigastric/chest pressure, feels like it could be relived with a large belch per patient. Non radiating. No hx of cardiac disease.  Afebrile here, tachycardic in the high 110s. Hypertensive. No subcutaneous emphysema, lungs clear. Mild epigastric pain, no other abdominal pain.  Will hydrate. Labs show leukocytosis. Will check CXR and troponin.  Labs otherwise ok, feeling better after fluids. Stable for discharge, given zofran.   Elwin MochaBlair Granger Chui, MD 03/12/14 [phone]

## 2014-03-11 NOTE — ED Notes (Signed)
MD at bedside. 

## 2014-03-11 NOTE — ED Notes (Signed)
Infusion Complete 

## 2014-03-11 NOTE — ED Notes (Signed)
Pt c/o N/V/D starting today

## 2014-03-12 LAB — URINALYSIS, ROUTINE W REFLEX MICROSCOPIC
GLUCOSE, UA: 100 mg/dL — AB
HGB URINE DIPSTICK: NEGATIVE
Ketones, ur: 15 mg/dL — AB
LEUKOCYTES UA: NEGATIVE
Nitrite: NEGATIVE
PH: 6.5 (ref 5.0–8.0)
Protein, ur: 100 mg/dL — AB
Specific Gravity, Urine: 1.025 (ref 1.005–1.030)
Urobilinogen, UA: 0.2 mg/dL (ref 0.0–1.0)

## 2014-03-12 LAB — URINE MICROSCOPIC-ADD ON

## 2014-03-12 MED ORDER — ONDANSETRON 4 MG PO TBDP: ORAL_TABLET | ORAL | Status: DC

## 2014-03-12 NOTE — ED Notes (Signed)
MD at bedside. 

## 2014-03-12 NOTE — Discharge Instructions (Signed)

## 2014-07-24 ENCOUNTER — Emergency Department (HOSPITAL_COMMUNITY)
Admission: EM | Admit: 2014-07-24 | Discharge: 2014-07-24 | Disposition: A | Discharge status: Home / Self Care | Payer: Self-pay | Attending: Emergency Medicine | Admitting: Emergency Medicine

## 2014-07-24 ENCOUNTER — Encounter (HOSPITAL_COMMUNITY): Payer: Self-pay | Admitting: Emergency Medicine

## 2014-07-24 ENCOUNTER — Emergency Department (HOSPITAL_COMMUNITY): Payer: Self-pay

## 2014-07-24 DIAGNOSIS — I10 Essential (primary) hypertension: Secondary | ICD-10-CM | POA: Insufficient documentation

## 2014-07-24 DIAGNOSIS — E663 Overweight: Secondary | ICD-10-CM | POA: Insufficient documentation

## 2014-07-24 LAB — CBC
HCT: 37.1 % (ref 36.0–46.0)
Hemoglobin: 12.2 g/dL (ref 12.0–15.0)
MCH: 26.3 pg (ref 26.0–34.0)
MCHC: 32.9 g/dL (ref 30.0–36.0)
MCV: 80.1 fL (ref 78.0–100.0)
Platelets: 312 10*3/uL (ref 150–400)
RBC: 4.63 MIL/uL (ref 3.87–5.11)
RDW: 14.6 % (ref 11.5–15.5)
WBC: 10.1 10*3/uL (ref 4.0–10.5)

## 2014-07-24 LAB — BASIC METABOLIC PANEL
Anion gap: 9 (ref 5–15)
BUN: 12 mg/dL (ref 6–23)
CALCIUM: 8.8 mg/dL (ref 8.4–10.5)
CO2: 26 mmol/L (ref 19–32)
CREATININE: 0.96 mg/dL (ref 0.50–1.10)
Chloride: 103 mmol/L (ref 96–112)
GFR calc Af Amer: 78 mL/min — ABNORMAL LOW (ref >90)
GFR, EST NON AFRICAN AMERICAN: 67 mL/min — AB (ref >90)
GLUCOSE: 93 mg/dL (ref 70–99)
Potassium: 3.8 mmol/L (ref 3.5–5.1)
Sodium: 138 mmol/L (ref 135–145)

## 2014-07-24 LAB — PROTIME-INR
INR: 1.04 (ref 0.00–1.49)
PROTHROMBIN TIME: 13.7 s (ref 11.6–15.2)

## 2014-07-24 LAB — I-STAT TROPONIN, ED
Troponin i, poc: 0 ng/mL (ref 0.00–0.08)
Troponin i, poc: 0.01 ng/mL (ref 0.00–0.08)

## 2014-07-24 LAB — BRAIN NATRIURETIC PEPTIDE: B NATRIURETIC PEPTIDE 5: 42.4 pg/mL (ref 0.0–100.0)

## 2014-07-24 LAB — APTT: APTT: 31 s (ref 24–37)

## 2014-07-24 LAB — D-DIMER, QUANTITATIVE: D-Dimer, Quant: 0.62 ug/mL-FEU — ABNORMAL HIGH (ref 0.00–0.48)

## 2014-07-24 MED ORDER — ASPIRIN 81 MG PO CHEW
324.0000 mg | CHEWABLE_TABLET | Freq: Once | ORAL | Status: AC
  Administered 2014-07-24: 324 mg via ORAL
  Filled 2014-07-24: qty 4

## 2014-07-24 MED ORDER — LISINOPRIL-HYDROCHLOROTHIAZIDE 20-12.5 MG PO TABS: 1.0000 | ORAL_TABLET | Freq: Every day | ORAL | Status: DC

## 2014-07-24 MED ORDER — SODIUM CHLORIDE 0.9 % IV SOLN
1000.0000 mL | INTRAVENOUS | Status: DC
  Administered 2014-07-24: 1000 mL via INTRAVENOUS

## 2014-07-24 MED ORDER — AMLODIPINE BESYLATE 5 MG PO TABS: 5.0000 mg | ORAL_TABLET | Freq: Every day | ORAL | Status: DC

## 2014-07-24 MED ORDER — HYDROCODONE-ACETAMINOPHEN 5-325 MG PO TABS: 2.0000 | ORAL_TABLET | ORAL | Status: DC | PRN

## 2014-07-24 MED ORDER — LISINOPRIL 20 MG PO TABS
20.0000 mg | ORAL_TABLET | Freq: Every day | ORAL | Status: DC
  Administered 2014-07-24: 20 mg via ORAL
  Filled 2014-07-24: qty 1

## 2014-07-24 MED ORDER — HYDRALAZINE HCL 20 MG/ML IJ SOLN
10.0000 mg | Freq: Once | INTRAMUSCULAR | Status: AC
  Administered 2014-07-24: 10 mg via INTRAVENOUS
  Filled 2014-07-24: qty 1

## 2014-07-24 MED ORDER — IOHEXOL 350 MG/ML SOLN
100.0000 mL | Freq: Once | INTRAVENOUS | Status: AC | PRN
  Administered 2014-07-24: 100 mL via INTRAVENOUS

## 2014-07-24 MED ORDER — HYDROCHLOROTHIAZIDE 25 MG PO TABS: 25.0000 mg | ORAL_TABLET | Freq: Every day | ORAL | Status: DC

## 2014-07-24 MED ORDER — LISINOPRIL 20 MG PO TABS: 20.0000 mg | ORAL_TABLET | Freq: Every day | ORAL | Status: DC

## 2014-07-24 MED ORDER — HYDROCHLOROTHIAZIDE 12.5 MG PO CAPS
12.5000 mg | ORAL_CAPSULE | Freq: Every day | ORAL | Status: DC
  Administered 2014-07-24: 12.5 mg via ORAL
  Filled 2014-07-24: qty 1

## 2014-07-24 NOTE — Progress Notes (Signed)
VASCULAR LAB PRELIMINARY  PRELIMINARY  PRELIMINARY  PRELIMINARY  Right lower extremity venous duplex completed.    Preliminary report:  Right:  No evidence of DVT, superficial thrombosis, or Baker's cyst.  Khyren Hing, RVT 07/24/2014, 2:39 PM

## 2014-07-24 NOTE — ED Provider Notes (Signed)
Patient signed out to me to follow-up on testing initiated by Dr. Lynelle DoctorKnapp. Patient seen for chest pain and shortness of breath. Patient does not have any known coronary artery disease. She does have a history of hypertension. She was previously treated with lisinopril and hydrochlorothiazide, has been off of her medications for some time. Patient was very hypertensive here in the ER. Her workup, otherwise, has been unremarkable. She does not have any significant kidney disease. Electrolytes and blood counts are normal. CT angiography does not suggest PE. She does have evidence of cardiomegaly on x-ray, but no overt failure. This is likely hypertrophy from uncontrolled blood pressure. Will be reinitiated on her Cipro and hydrochlorothiazide, add Norvasc. Given resources for primary care follow-up. Return for worsening symptoms.  Gilda Creasehristopher J Pollina, MD 07/24/14 2118

## 2014-07-24 NOTE — Discharge Instructions (Signed)
Hypertension °Hypertension, commonly called high blood pressure, is when the force of blood pumping through your arteries is too strong. Your arteries are the blood vessels that carry blood from your heart throughout your body. A blood pressure reading consists of a higher number over a lower number, such as 110/72. The higher number (systolic) is the pressure inside your arteries when your heart pumps. The lower number (diastolic) is the pressure inside your arteries when your heart relaxes. Ideally you want your blood pressure below 120/80. °Hypertension forces your heart to work harder to pump blood. Your arteries may become narrow or stiff. Having hypertension puts you at risk for heart disease, stroke, and other problems.  °RISK FACTORS °Some risk factors for high blood pressure are controllable. Others are not.  °Risk factors you cannot control include:  °· Race. You may be at higher risk if you are African American. °· Age. Risk increases with age. °· Gender. Men are at higher risk than women before age 45 years. After age 65, women are at higher risk than men. °Risk factors you can control include: °· Not getting enough exercise or physical activity. °· Being overweight. °· Getting too much fat, sugar, calories, or salt in your diet. °· Drinking too much alcohol. °SIGNS AND SYMPTOMS °Hypertension does not usually cause signs or symptoms. Extremely high blood pressure (hypertensive crisis) may cause headache, anxiety, shortness of breath, and nosebleed. °DIAGNOSIS  °To check if you have hypertension, your health care provider will measure your blood pressure while you are seated, with your arm held at the level of your heart. It should be measured at least twice using the same arm. Certain conditions can cause a difference in blood pressure between your right and left arms. A blood pressure reading that is higher than normal on one occasion does not mean that you need treatment. If one blood pressure reading  is high, ask your health care provider about having it checked again. °TREATMENT  °Treating high blood pressure includes making lifestyle changes and possibly taking medicine. Living a healthy lifestyle can help lower high blood pressure. You may need to change some of your habits. °Lifestyle changes may include: °· Following the DASH diet. This diet is high in fruits, vegetables, and whole grains. It is low in salt, red meat, and added sugars. °· Getting at least 2½ hours of brisk physical activity every week. °· Losing weight if necessary. °· Not smoking. °· Limiting alcoholic beverages. °· Learning ways to reduce stress. ° If lifestyle changes are not enough to get your blood pressure under control, your health care provider may prescribe medicine. You may need to take more than one. Work closely with your health care provider to understand the risks and benefits. °HOME CARE INSTRUCTIONS °· Have your blood pressure rechecked as directed by your health care provider.   °· Take medicines only as directed by your health care provider. Follow the directions carefully. Blood pressure medicines must be taken as prescribed. The medicine does not work as well when you skip doses. Skipping doses also puts you at risk for problems.   °· Do not smoke.   °· Monitor your blood pressure at home as directed by your health care provider.  °SEEK MEDICAL CARE IF:  °· You think you are having a reaction to medicines taken. °· You have recurrent headaches or feel dizzy. °· You have swelling in your ankles. °· You have trouble with your vision. °SEEK IMMEDIATE MEDICAL CARE IF: °· You develop a severe headache or confusion. °·   You have unusual weakness, numbness, or feel faint. °· You have severe chest or abdominal pain. °· You vomit repeatedly. °· You have trouble breathing. °MAKE SURE YOU:  °· Understand these instructions. °· Will watch your condition. °· Will get help right away if you are not doing well or get worse. °Document  Released: 04/07/2005 Document Revised: 08/22/2013 Document Reviewed: 01/28/2013 °ExitCare® Patient Information ©2015 [employer]. This information is not intended to replace advice given to you by your health care provider. Make sure you discuss any questions you have with your health care provider. ° ° °Emergency Department Resource Guide °1) Find a Doctor and Pay Out of Pocket °Although you won't have to find out who is covered by your insurance plan, it is a good idea to ask around and get recommendations. You will then need to call the office and see if the doctor you have chosen will accept you as a new patient and what types of options they offer for patients who are self-pay. Some doctors offer discounts or will set up payment plans for their patients who do not have insurance, but you will need to ask so you aren't surprised when you get to your appointment. ° °2) Contact Your Local Health Department °Not all health departments have doctors that can see patients for sick visits, but many do, so it is worth a call to see if yours does. If you don't know where your local health department is, you can check in your phone book. The CDC also has a tool to help you locate your state's health department, and many state websites also have listings of all of their local health departments. ° °3) Find a Walk-in Clinic °If your illness is not likely to be very severe or complicated, you may want to try a walk in clinic. These are popping up all over the country in pharmacies, drugstores, and shopping centers. They're usually staffed by nurse practitioners or physician assistants that have been trained to treat common illnesses and complaints. They're usually fairly quick and inexpensive. However, if you have serious medical issues or chronic medical problems, these are probably not your best option. ° °No Primary Care Doctor: °- Call [facility] at  832-8000 - they can help you locate a primary care doctor that   accepts your insurance, provides certain services, etc. °- Physician Referral Service- 1-800-533-3463 ° °Chronic Pain Problems: °Organization         Address  Phone   Notes  °[facility]  (336) 297-2271 Patients need to be referred by their primary care doctor.  ° °Medication Assistance: °Organization         Address  Phone   Notes  °[place] [facility] [address] °[place], [place] [address] (336) 641-8030 --Must be a resident of [place] °-- Must have NO insurance coverage whatsoever (no Medicaid/ Medicare, etc.) °-- The pt. MUST have a primary care doctor that directs their care regularly and follows them in the community °  °[facility]  (866) 331-1348   °[employer]  (888) 892-1162   ° °Agencies that provide inexpensive medical care: °Organization         Address  Phone   Notes  °[facility]  (336) 832-8035   ° Internal Medicine    (336) 832-7272   °[facility] [address] °[place], [place] [address] (336) 832-4777   °[facility] [address], °  [place] (336) 271-4999   °[facility]    (336) 373-0678   °[facility]    (336) 272-1050   °[facility] ° [address] Phone:  (336) 832-4444, Fax:  (336) 832-4440 Hours of Operation:  9 am - 6 pm, M-F.  Also accepts Medicaid/Medicare and self-pay.  °[facility] ° [address], [place] Phone: (336) 832-3150, Fax: (336) 832-3151. Hours of Operation:  8:30 am - 5:30 pm, M-F.  Also accepts Medicaid and self-pay.  °[facility] [place] [address], [place] Phone: (336) 878-6027   °[facility] [address], [place], [place] (336)723-1848, Ext. 123 Mondays & Thursdays: 7-9 AM.  First 15 patients are seen on a first come, first serve basis. °  ° °Medicaid-accepting [place] Providers: ° °Organization          Address  Phone   Notes  °[facility] [address], [place] (336) 641-2100 Also accepts self-pay patients.  °[facility] [address], [place] ° (336) 856-9996   °[facility] [address], [place] (336) 288-8857   °[facility] [address] [place] [address], [place] (336) 299-7000   °Veita Bland [address], [place]  ° (336) 373-1557 Only accepts [place] Access Medicaid patients after they have their name applied to their card.  ° °Self-Pay (no insurance) in [place]: ° °Organization         Address  Phone   Notes  °Sickle Cell Patients, [facility] [address], [place] (336) 832-1970   °[facility] [address], [place] (336) 832-4400   °[facility] ° [address], [place] (336) 992-4800   °[facility]/Dr. Osei-Bonsu ° [address] [place] [address], [place] or [address], [place] (336) 841-8500 Phone number for both [place] and [place] locations is the same.  °[facility] [address], [place] (336) 299-0000   °[facility] [address] [place] [address], [place] or [address] (336) 852-7530 °(336) 878-2260   °[facility] [address], [place] (336) 350-1642, phone; (336) 294-5005, fax Sees patients 1st and 3rd Saturday of every month.  Must not qualify for public or private insurance (i.e. Medicaid, Medicare, [place] Health Choice, Veterans' Benefits) • Household income should be no more than 200% of the poverty level •The clinic cannot treat you if you are pregnant or think you are pregnant • Sexually transmitted diseases are not treated at the clinic.  ° ° °Dental Care: °Organization         Address  Phone  Notes  °[facility] [address],   (336) 641-6152 Accepts children up to age 21 who are enrolled in Medicaid or [place] Health Choice; pregnant women with a Medicaid card; and children who have applied for Medicaid or [place] Health Choice, but were declined, whose parents can pay a reduced fee at time of service.  °[facility] [place]  [address], [place] (336) 641-7733 Accepts children up to age 21 who are enrolled in Medicaid or [place] Health Choice; pregnant women with a Medicaid card; and children who have applied for Medicaid or    Health Choice, but were declined, whose parents can pay a reduced fee at time of service.  °[facility] ° [address], [place] (336) 641-4533 Patients are seen by appointment only. Walk-ins are not accepted. [facility] will see patients 18 years of age and older. °Monday - Tuesday (8am-5pm) °Most Wednesdays (8:30-5pm) °$30 per visit, cash only  °[facility] ° [address], [place] (336) 641-4533 Patients are seen by appointment only. Walk-ins are not accepted. [facility] will see patients 18 years of age and older. °One Wednesday Evening (Monthly: Volunteer Based).  $30 per visit, cash only  °[facility]  (919) 537-3737 for adults; Children under age 4, call Graduate Pediatric Dentistry at (919) [phone]. Children aged 4-14, please call (919) [phone] to request a pediatric application. ° Dental services are provided in all areas of dental care including fillings, crowns and bridges, complete and partial dentures, implants, gum treatment, root canals, and extractions. Preventive care is also provided. Treatment is provided to both adults and children. °Patients are selected via a lottery and there is often a waiting list. °  °[facility] [address], °Center ° (336) 763-8833 [email or web address] °  °[facility] [address], [place], [place]  (336)723-1848, Ext. 123 Second and Fourth Thursday of each month, opens at 6:30 AM; Clinic ends at 9 AM.  Patients are seen on a first-come first-served basis, and a limited number are seen during each clinic.  ° °[facility] ° [address], [place], [place] (336) 723-7904   Eligibility Requirements °You must have lived in [place], or [place] counties for at least the last three months. °  You cannot be eligible for state or federal sponsored healthcare insurance, including Veterans Administration, Medicaid, or Medicare. °  You generally cannot be eligible for healthcare insurance through your employer.  °  How to apply: °Eligibility screenings are held every Tuesday and Wednesday afternoon from 1:00 pm until 4:00 pm. You do not need an appointment for the interview!  °[facility] [address], [place], [place] 336-631-2330   °[facility]  336-342-8273   °[facility]  336-703-3100   °[facility]  336-570-6415   ° °Behavioral Health Resources in the Community: °Intensive Outpatient Programs °Organization         Address  Phone  Notes  °[facility] [address], [place], [place] 336-878-6098   °[facility] [address], [place], [place] 336-832-9800   °[facility] [address], [place], [place] ° 336-882-2125   °[facility] [address],  °[place], [place] 1-800-853-5163 or 336-641-4981   °Substance Abuse Resources °Organization         Address  Phone  Notes  °[facility]  336-882-2125   °[facility]  336-784-9470   °[facility]  336-285-9073   °[facility]  336-845-3988   °Residential & Outpatient Substance Abuse Program  1-800-659-3381   °Psychological Services °Organization         Address  Phone  Notes  °[facility]  336- 832-9600   °[facility]  336- 378-7881    °[facility] [address], [place] 1-800-853-5163 or 336-641-4981   ° °Mobile Crisis Teams °Organization         Address  Phone    Notes  °[facility], Mobile Crisis Care Unit  1-877-626-1772   °Assertive °[facility] ° [address] [place], [place] 336-834-9664   °Sharon DeEsch [address] °[place] 336-554-5454   ° °Self-Help/Support Groups °Organization         Address  Phone             Notes  °[facility] - variety of support groups  336- 373-1402 Call for more information  °Narcotics Anonymous (NA), [facility] [address], °[place]  2 meetings at this location  ° °Residential Treatment Programs °Organization         Address  Phone  Notes  °[facility] [address],    °[place]  1-866-801-8205   °[facility] ° [address], [place], [place] 704-293-8524   °[facility] [address], [place] 336-845-3988 Admissions: 8am-3pm M-F  °[facility] [address]    °[place], [place] 336-841-1104   °[facility] [address], [place], [place] 336-379-7146   °[facility] [address]  °[place], [place] 336-285-9073   °[facility] Intensive Outpatient [address], [place], [place] 336-852-3033   °[facility] [address]  °[place], [place] 1-877-615-2722 or 336-784-9470   °[facility] [address] [place], [place] 336-227-7417 Accepts Medicaid  °[facility] [address]  °[place] 1-800-659-3381 Substance Abuse/Addiction Treatment  ° °[place] Behavioral Health Resources °Organization         Address  Phone  Notes  °[facility]  (888) 581-9988   °Julie Brannon, PhD [address] [place], [place]   (336) 349-5553 or (336) 951-0000   °[facility]   [address] °[place], [place] (336) 349-4454   °[facility] [address], [place], [place] (336) 342-8316 Insurance/Medicaid/sponsorship through [facility]  °[facility] [address]                                    [place], [place] (336) 342-8316 Therapy/tele-psych/case  °[facility] [address]  ° [place], [place] (336) 349-2233    °Dr. Arfeen  (336) 349-4544   °[facility] [place]  [employer] [place] [facility] 1) [address], [place] °2) [address], [place] °3)  [address], [place] (336) 349-3220 °(336) 342-7768 ° °(336) 342-8140   °[place] [facility] (336) 342-1394 or (336) 342-3537 (After Hours)    ° ° ° °

## 2014-07-24 NOTE — ED Notes (Signed)
Patient states has not taken blood pressure medication "for a while" does not have a Doctor currently.

## 2014-07-24 NOTE — ED Notes (Signed)
Pt reports chest tightness, wheezing, and leg swelling onset this weekend. Pt has been taking zantac without relief.

## 2014-07-24 NOTE — ED Notes (Signed)
Pt reports out of BP meds for a while. Pt does not have a PMD.

## 2014-07-24 NOTE — ED Provider Notes (Signed)
CSN: [id number]     Arrival date & time 07/24/14  1054 History   First MD Initiated Contact with Patient 07/24/14 1133     Chief Complaint  Patient presents with  . Chest Pain  . Wheezing  . Leg Swelling    HPI Pt started with a cough about three weeks ago.  Bringing up some mucus.  No fevers. That continued but over the weekend she started to feel short of breath which is new.  She also thought she was wheezing.  She has noticed pressure her chest as well for several days that did not get better with antacids.  She history of knee problems with from a surgery many years ago but in the last week she has felt cramping in her foot and pain and swelling in the entire leg.    No hx of DVT or PE. Past Medical History  Diagnosis Date  . Hypertension    Past Surgical History  Procedure Laterality Date  . Cholecystectomy    . Abdominal hysterectomy    . Tubal ligation    . Knee arthroscopy     No family history on file. History  Substance Use Topics  . Smoking status: Never Smoker   . Smokeless tobacco: Not on file  . Alcohol Use: No   OB History    No data available     Review of Systems  All other systems reviewed and are negative.     Allergies  Percocet  Home Medications   Prior to Admission medications   Medication Sig Start Date End Date Taking? Authorizing Provider  ibuprofen (ADVIL,MOTRIN) 200 MG tablet Take 400 mg by mouth every 6 (six) hours as needed (pain).    Historical Provider, MD  lisinopril-hydrochlorothiazide (PRINZIDE) 20-12.5 MG per tablet Take 1 tablet by mouth daily. 03/07/14   Rolland Porter, MD  meloxicam (MOBIC) 15 MG tablet Take 1 tablet (15 mg total) by mouth daily. Patient not taking: Reported on 03/11/2014 03/07/14   Rolland Porter, MD  ondansetron [facility] ODT) 4 MG disintegrating tablet  ODT q6 hours prn nausea/vomiting 03/12/14   Elwin Mocha, MD  UNKNOWN TO PATIENT Take by mouth daily. Pt is taking a fluid pill but does not know the name of it.     Historical Provider, MD   BP 177/126 mmHg  Pulse 83  Temp(Src) 98.4 F (36.9 C) (Oral)  Resp 22  Ht  (1.575 m)  SpO2 98% Physical Exam  Constitutional: No distress.  Overweight  HENT:  Head: Normocephalic and atraumatic.  Right Ear: External ear normal.  Left Ear: External ear normal.  Eyes: Conjunctivae are normal. Right eye exhibits no discharge. Left eye exhibits no discharge. No scleral icterus.  Neck: Neck supple. No tracheal deviation present.  Cardiovascular: Normal rate, regular rhythm and intact distal pulses.   Pulmonary/Chest: Effort normal and breath sounds normal. No stridor. No respiratory distress. She has no wheezes. She has no rales.  Abdominal: Soft. Bowel sounds are normal. She exhibits no distension. There is no tenderness. There is no rebound and no guarding.  Musculoskeletal: She exhibits no edema or tenderness.  No edema noted to bilateral lower extremities, they appear symmetrical, strong pulses, extremities are warm and well perfused  Neurological: She is alert. She has normal strength. No cranial nerve deficit (no facial droop, extraocular movements intact, no slurred speech) or sensory deficit. She exhibits normal muscle tone. She displays no seizure activity. Coordination normal.  Skin: Skin is warm and dry. No  rash noted.  Psychiatric: She has a normal mood and affect.  Nursing note and vitals reviewed.   ED Course  Procedures (including critical care time) Labs Review Labs Reviewed  BASIC METABOLIC PANEL - Abnormal; Notable for the following:    GFR calc non Af Amer 67 (*)    GFR calc Af Amer 78 (*)    All other components within normal limits  D-DIMER, QUANTITATIVE - Abnormal; Notable for the following:    D-Dimer, Quant 0.62 (*)    All other components within normal limits  CBC  BRAIN NATRIURETIC PEPTIDE  APTT  PROTIME-INR  I-STAT TROPOININ, ED  I-STAT TROPOININ, ED    Imaging Review Dg Chest 2 View (if Patient Has Fever And/or  Copd)  07/24/2014   CLINICAL DATA:  Chest tightness and wheezing  EXAM: CHEST  2 VIEW  COMPARISON:  March 11, 2014  FINDINGS: Lungs are clear. Heart size and pulmonary vascularity are normal. No adenopathy. No pneumothorax. No bone lesions. 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  IMPRESSION: No edema or consolidation.   Electronically Signed   By: Bretta BangWilliam  Woodruff III M.D.   On: 07/24/2014 12:29     EKG Interpretation   Date/Time:  Monday July 24 2014 11:18:07 EDT Ventricular Rate:  82 PR Interval:  138 QRS Duration: 80 QT Interval:  356 QTC Calculation: 415 R Axis:   72 Text Interpretation:  Normal sinus rhythm Nonspecific T wave abnormality  Abnormal ECG No significant change since last tracing Confirmed by Jireh Vinas   MD-J, Shadee Rathod ([id number]) on 07/24/2014 11:34:17 AM      MDM   Doppler study is negative for DVT.  No signs of CHF on CXR.  Pt does have an elevated d dimer.  Will ct chest.  If negative plan on dc home with patient restarting her BP meds.  Pt is not taking her BP meds.    Linwood DibblesJon Yichen Gilardi, MD 07/24/14 [phone]

## 2014-07-24 NOTE — ED Notes (Signed)
Pt permitted to eat per MD.

## 2015-07-03 ENCOUNTER — Other Ambulatory Visit: Payer: Self-pay | Admitting: Obstetrics & Gynecology

## 2015-07-03 DIAGNOSIS — R928 Other abnormal and inconclusive findings on diagnostic imaging of breast: Secondary | ICD-10-CM

## 2015-07-03 DIAGNOSIS — N6489 Other specified disorders of breast: Secondary | ICD-10-CM

## 2015-07-16 ENCOUNTER — Ambulatory Visit
Admission: RE | Admit: 2015-07-16 | Discharge: 2015-07-16 | Disposition: A | Discharge status: Home / Self Care | Payer: BLUE CROSS/BLUE SHIELD | Source: Ambulatory Visit | Attending: Obstetrics & Gynecology | Admitting: Obstetrics & Gynecology

## 2015-07-16 DIAGNOSIS — R928 Other abnormal and inconclusive findings on diagnostic imaging of breast: Secondary | ICD-10-CM

## 2018-03-29 ENCOUNTER — Other Ambulatory Visit: Payer: Self-pay

## 2018-03-29 ENCOUNTER — Encounter (HOSPITAL_COMMUNITY): Payer: Self-pay

## 2018-03-29 ENCOUNTER — Emergency Department (HOSPITAL_COMMUNITY): Payer: Self-pay

## 2018-03-29 ENCOUNTER — Ambulatory Visit (HOSPITAL_BASED_OUTPATIENT_CLINIC_OR_DEPARTMENT_OTHER): Payer: Self-pay

## 2018-03-29 ENCOUNTER — Observation Stay (HOSPITAL_COMMUNITY)
Admission: EM | Admit: 2018-03-29 | Discharge: 2018-03-30 | Disposition: A | Discharge status: Home / Self Care | Payer: Self-pay | Attending: Internal Medicine | Admitting: Internal Medicine

## 2018-03-29 DIAGNOSIS — Z885 Allergy status to narcotic agent status: Secondary | ICD-10-CM

## 2018-03-29 DIAGNOSIS — I160 Hypertensive urgency: Secondary | ICD-10-CM

## 2018-03-29 DIAGNOSIS — M7989 Other specified soft tissue disorders: Secondary | ICD-10-CM

## 2018-03-29 DIAGNOSIS — R0600 Dyspnea, unspecified: Secondary | ICD-10-CM

## 2018-03-29 DIAGNOSIS — Z791 Long term (current) use of non-steroidal anti-inflammatories (NSAID): Secondary | ICD-10-CM

## 2018-03-29 DIAGNOSIS — Z6841 Body Mass Index (BMI) 40.0 and over, adult: Secondary | ICD-10-CM | POA: Insufficient documentation

## 2018-03-29 DIAGNOSIS — R0602 Shortness of breath: Secondary | ICD-10-CM

## 2018-03-29 DIAGNOSIS — I110 Hypertensive heart disease with heart failure: Secondary | ICD-10-CM | POA: Insufficient documentation

## 2018-03-29 DIAGNOSIS — I509 Heart failure, unspecified: Secondary | ICD-10-CM | POA: Insufficient documentation

## 2018-03-29 DIAGNOSIS — R51 Headache: Secondary | ICD-10-CM

## 2018-03-29 DIAGNOSIS — R079 Chest pain, unspecified: Secondary | ICD-10-CM

## 2018-03-29 DIAGNOSIS — E669 Obesity, unspecified: Secondary | ICD-10-CM | POA: Insufficient documentation

## 2018-03-29 DIAGNOSIS — I10 Essential (primary) hypertension: Secondary | ICD-10-CM

## 2018-03-29 HISTORY — DX: Hypertensive urgency: I16.0

## 2018-03-29 HISTORY — DX: Dyspnea, unspecified: R06.00

## 2018-03-29 LAB — BASIC METABOLIC PANEL
Anion gap: 12 (ref 5–15)
BUN: 12 mg/dL (ref 6–20)
CO2: 23 mmol/L (ref 22–32)
Calcium: 8.8 mg/dL — ABNORMAL LOW (ref 8.9–10.3)
Chloride: 105 mmol/L (ref 98–111)
Creatinine, Ser: 1.08 mg/dL — ABNORMAL HIGH (ref 0.44–1.00)
GFR calc Af Amer: >60 mL/min (ref >60)
GFR calc non Af Amer: 58 mL/min — ABNORMAL LOW (ref >60)
GLUCOSE: 80 mg/dL (ref 70–99)
Potassium: 3.8 mmol/L (ref 3.5–5.1)
Sodium: 140 mmol/L (ref 135–145)

## 2018-03-29 LAB — CBC WITH DIFFERENTIAL/PLATELET
ABS IMMATURE GRANULOCYTES: 0.04 10*3/uL (ref 0.00–0.07)
Basophils Absolute: 0.1 10*3/uL (ref 0.0–0.1)
Basophils Relative: 1 %
Eosinophils Absolute: 0.1 10*3/uL (ref 0.0–0.5)
Eosinophils Relative: 1 %
HCT: 37.5 % (ref 36.0–46.0)
Hemoglobin: 11.6 g/dL — ABNORMAL LOW (ref 12.0–15.0)
IMMATURE GRANULOCYTES: 0 %
LYMPHS ABS: 1.5 10*3/uL (ref 0.7–4.0)
Lymphocytes Relative: 13 %
MCH: 25.4 pg — ABNORMAL LOW (ref 26.0–34.0)
MCHC: 30.9 g/dL (ref 30.0–36.0)
MCV: 82.1 fL (ref 80.0–100.0)
MONOS PCT: 8 %
Monocytes Absolute: 0.9 10*3/uL (ref 0.1–1.0)
NEUTROS PCT: 77 %
Neutro Abs: 8.3 10*3/uL — ABNORMAL HIGH (ref 1.7–7.7)
PLATELETS: 314 10*3/uL (ref 150–400)
RBC: 4.57 MIL/uL (ref 3.87–5.11)
RDW: 14.6 % (ref 11.5–15.5)
WBC: 10.8 10*3/uL — ABNORMAL HIGH (ref 4.0–10.5)
nRBC: 0 % (ref 0.0–0.2)

## 2018-03-29 LAB — BRAIN NATRIURETIC PEPTIDE: B Natriuretic Peptide: 62.9 pg/mL (ref 0.0–100.0)

## 2018-03-29 LAB — TROPONIN I: Troponin I: <0.03 ng/mL (ref <0.03)

## 2018-03-29 LAB — D-DIMER, QUANTITATIVE (NOT AT ARMC): D DIMER QUANT: 1.06 ug{FEU}/mL — AB (ref 0.00–0.50)

## 2018-03-29 LAB — OCCULT BLOOD X 1 CARD TO LAB, STOOL: Fecal Occult Bld: NEGATIVE

## 2018-03-29 MED ORDER — ACETAMINOPHEN 325 MG PO TABS
650.0000 mg | ORAL_TABLET | Freq: Four times a day (QID) | ORAL | Status: DC | PRN
  Administered 2018-03-30: 650 mg via ORAL
  Filled 2018-03-29: qty 2

## 2018-03-29 MED ORDER — IOPAMIDOL (ISOVUE-370) INJECTION 76%
100.0000 mL | Freq: Once | INTRAVENOUS | Status: AC | PRN
  Administered 2018-03-29: 100 mL via INTRAVENOUS

## 2018-03-29 MED ORDER — ACETAMINOPHEN 650 MG RE SUPP: 650.0000 mg | Freq: Four times a day (QID) | RECTAL | Status: DC | PRN

## 2018-03-29 MED ORDER — ACETAMINOPHEN 325 MG PO TABS
650.0000 mg | ORAL_TABLET | Freq: Once | ORAL | Status: AC
  Administered 2018-03-29: 650 mg via ORAL
  Filled 2018-03-29: qty 2

## 2018-03-29 MED ORDER — ASPIRIN 81 MG PO CHEW
324.0000 mg | CHEWABLE_TABLET | Freq: Once | ORAL | Status: AC
  Administered 2018-03-29: 324 mg via ORAL
  Filled 2018-03-29: qty 4

## 2018-03-29 MED ORDER — LABETALOL HCL 5 MG/ML IV SOLN
10.0000 mg | Freq: Once | INTRAVENOUS | Status: AC
  Administered 2018-03-29: 10 mg via INTRAVENOUS
  Filled 2018-03-29: qty 4

## 2018-03-29 MED ORDER — NITROGLYCERIN 0.4 MG SL SUBL
0.4000 mg | SUBLINGUAL_TABLET | SUBLINGUAL | Status: DC | PRN
  Administered 2018-03-29: 0.4 mg via SUBLINGUAL
  Filled 2018-03-29: qty 1

## 2018-03-29 MED ORDER — IOPAMIDOL (ISOVUE-370) INJECTION 76%
INTRAVENOUS | Status: AC
  Filled 2018-03-29: qty 100

## 2018-03-29 MED ORDER — LABETALOL HCL 5 MG/ML IV SOLN
0.5000 mg/min | INTRAVENOUS | Status: DC
  Filled 2018-03-29: qty 100

## 2018-03-29 MED ORDER — LIDOCAINE VISCOUS HCL 2 % MT SOLN
15.0000 mL | Freq: Once | OROMUCOSAL | Status: DC
  Filled 2018-03-29: qty 15

## 2018-03-29 MED ORDER — AMLODIPINE BESYLATE 10 MG PO TABS
10.0000 mg | ORAL_TABLET | Freq: Every day | ORAL | Status: DC
  Administered 2018-03-29 – 2018-03-30 (×2): 10 mg via ORAL
  Filled 2018-03-29 (×2): qty 1

## 2018-03-29 MED ORDER — PROMETHAZINE HCL 25 MG PO TABS: 12.5000 mg | ORAL_TABLET | Freq: Four times a day (QID) | ORAL | Status: DC | PRN

## 2018-03-29 MED ORDER — SENNOSIDES-DOCUSATE SODIUM 8.6-50 MG PO TABS: 1.0000 | ORAL_TABLET | Freq: Every evening | ORAL | Status: DC | PRN

## 2018-03-29 MED ORDER — ALUM & MAG HYDROXIDE-SIMETH 200-200-20 MG/5ML PO SUSP
30.0000 mL | Freq: Once | ORAL | Status: DC
  Filled 2018-03-29: qty 30

## 2018-03-29 MED ORDER — ENOXAPARIN SODIUM 40 MG/0.4ML ~~LOC~~ SOLN
40.0000 mg | SUBCUTANEOUS | Status: DC
  Administered 2018-03-29: 40 mg via SUBCUTANEOUS
  Filled 2018-03-29 (×2): qty 0.4

## 2018-03-29 MED ORDER — BLOOD PRESSURE CONTROL BOOK
Freq: Once | Status: AC
  Administered 2018-03-29: 19:00:00
  Filled 2018-03-29: qty 1

## 2018-03-29 NOTE — ED Notes (Signed)
Pt c/o increased headache after nitro. MD aware.

## 2018-03-29 NOTE — ED Notes (Signed)
Pt to xray

## 2018-03-29 NOTE — ED Notes (Signed)
Patient transported to X-ray 

## 2018-03-29 NOTE — ED Provider Notes (Signed)
[facility] EMERGENCY DEPARTMENT Provider Note   CSN: [id number] Arrival date & time: 03/29/18  4098     History   Chief Complaint Chief Complaint  Patient presents with  . Leg Swelling    HPI Maria Greene is a 57 y.o. female.  HPI  57 year old female presents with chest pain.  Started around 3 or 4 AM.  The patient states she is been having a headache over the last couple days that she thought might be sinuses.  She took some Advil yesterday and it resolved.  She is noticed an on and off cough this morning along with chest pressure this rated an 8 out of 10 in addition to shortness of breath.  No back pain currently.  Over the last couple days she is also noticed right lower extremity swelling.  She states that she had a knee replacement on that side and she on and off has some troubles with swelling but this was much more than typical.  The swelling has resolved.  Her whole leg felt like it was very tight.  She feels like she is had a little bit of wheezing.  Past Medical History:  Diagnosis Date  . Hypertension     Patient Active Problem List   Diagnosis Date Noted  . Hypertensive urgency 03/29/2018    Past Surgical History:  Procedure Laterality Date  . ABDOMINAL HYSTERECTOMY    . CHOLECYSTECTOMY    . KNEE ARTHROSCOPY    . TUBAL LIGATION       OB History   None      Home Medications    Prior to Admission medications   Medication Sig Start Date End Date Taking? Authorizing Provider  aspirin-acetaminophen-caffeine (EXCEDRIN MIGRAINE) [phone] MG tablet Take 1 tablet by mouth every 6 (six) hours as needed for headache.   Yes [provider]  ibuprofen (ADVIL,MOTRIN) 200 MG tablet Take 400 mg by mouth every 6 (six) hours as needed for moderate pain (swelling in legs.).    Yes [provider]  lisinopril-hydrochlorothiazide (PRINZIDE,ZESTORETIC) 20-25 MG tablet Take 1 tablet by mouth daily.   Yes [provider]    metoprolol tartrate (LOPRESSOR) 25 MG tablet Take 25 mg by mouth 2 (two) times daily.   Yes [provider]  amLODipine (NORVASC) 5 MG tablet Take 1 tablet (5 mg total) by mouth daily. Patient not taking: Reported on 03/29/2018 07/24/14   Gilda Crease, MD  hydrochlorothiazide (HYDRODIURIL) 25 MG tablet Take 1 tablet (25 mg total) by mouth daily. Patient not taking: Reported on 03/29/2018 03/28/11 03/27/12  Samuel Jester, DO  hydrochlorothiazide (HYDRODIURIL) 25 MG tablet Take 1 tablet (25 mg total) by mouth daily. Patient not taking: Reported on 03/29/2018 07/24/14   Gilda Crease, MD  HYDROcodone-acetaminophen (NORCO/VICODIN) 5-325 MG per tablet Take 2 tablets by mouth every 4 (four) hours as needed for moderate pain. Patient not taking: Reported on 03/29/2018 07/24/14   Gilda Crease, MD  lisinopril (PRINIVIL,ZESTRIL) 20 MG tablet Take 1 tablet (20 mg total) by mouth daily. Patient not taking: Reported on 03/29/2018 07/24/14   Gilda Crease, MD  lisinopril-hydrochlorothiazide (PRINZIDE) 20-12.5 MG per tablet Take 1 tablet by mouth daily. Patient not taking: Reported on 07/24/2014 03/07/14   Rolland Porter, MD  meloxicam (MOBIC) 15 MG tablet Take 1 tablet (15 mg total) by mouth daily. Patient not taking: Reported on 03/11/2014 03/07/14   Rolland Porter, MD  ondansetron (ZOFRAN ODT) 4 MG disintegrating tablet 4mg  ODT q6 hours  prn nausea/vomiting Patient not taking: Reported on 07/24/2014 03/12/14   Elwin Mocha, MD    Family History No family history on file.  Social History Social History   Tobacco Use  . Smoking status: Never Smoker  Substance Use Topics  . Alcohol use: No  . Drug use: No     Allergies   Percocet [oxycodone-acetaminophen]   Review of Systems Review of Systems  HENT: Positive for congestion.   Respiratory: Positive for cough and shortness of breath.   Cardiovascular: Positive for chest pain and leg swelling.  Gastrointestinal:  Negative for abdominal pain.  Musculoskeletal: Negative for back pain.  All other systems reviewed and are negative.    Physical Exam Updated Vital Signs BP (!) 214/116   Pulse 90   Temp 98.2 F (36.8 C) (Oral)   Resp 18   Ht 5\' 2"  (1.575 m)   Wt 125.6 kg   SpO2 97%   BMI 50.66 kg/m   Physical Exam  Constitutional: She appears well-developed and well-nourished. No distress.  obese  HENT:  Head: Normocephalic and atraumatic.  Right Ear: External ear normal.  Left Ear: External ear normal.  Nose: Nose normal.  Eyes: Right eye exhibits no discharge. Left eye exhibits no discharge.  Cardiovascular: Normal rate, regular rhythm and normal heart sounds.  Pulses:      Radial pulses are 2+ on the right side, and 2+ on the left side.       Dorsalis pedis pulses are 2+ on the right side, and 2+ on the left side.  Pulmonary/Chest: Effort normal and breath sounds normal. She exhibits no tenderness.  Abdominal: Soft. There is no tenderness.  Musculoskeletal:  No specific tenderness in RLE. No pitting edema to both.   Neurological: She is alert.  Skin: Skin is warm and dry. She is not diaphoretic.  Psychiatric: Her mood appears not anxious.  Nursing note and vitals reviewed.    ED Treatments / Results  Labs (all labs ordered are listed, but only abnormal results are displayed) Labs Reviewed  BASIC METABOLIC PANEL - Abnormal; Notable for the following components:      Result Value   Creatinine, Ser 1.08 (*)    Calcium 8.8 (*)    GFR calc non Af Amer 58 (*)    All other components within normal limits  CBC WITH DIFFERENTIAL/PLATELET - Abnormal; Notable for the following components:   WBC 10.8 (*)    Hemoglobin 11.6 (*)    MCH 25.4 (*)    Neutro Abs 8.3 (*)    All other components within normal limits  D-DIMER, QUANTITATIVE (NOT AT [facility]) - Abnormal; Notable for the following components:   D-Dimer, Quant 1.06 (*)    All other components within normal limits  BRAIN  NATRIURETIC PEPTIDE  TROPONIN I  HIV ANTIBODY (ROUTINE TESTING W REFLEX)    EKG EKG Interpretation  Date/Time:  Monday March 29 2018 09:37:32 EST Ventricular Rate:  106 PR Interval:    QRS Duration: 89 QT Interval:  323 QTC Calculation: 429 R Axis:   69 Text Interpretation:  Sinus tachycardia Nonspecific repol abnormality, lateral leads Baseline wander in lead(s) V3 rate is faster, otherwise similar to 2016 Confirmed by Pricilla Loveless [phone]) on 03/29/2018 9:44:42 AM   Radiology Dg Chest 2 View  Result Date: 03/29/2018 CLINICAL DATA:  Chest pain.  Left leg swelling. EXAM: CHEST - 2 VIEW COMPARISON:  Chest CT 07/24/2014 FINDINGS: Heart and mediastinal contours are within normal limits. No focal opacities or effusions.  No acute bony abnormality. IMPRESSION: No active cardiopulmonary disease. Electronically Signed   By: Charlett Nose M.D.   On: 03/29/2018 10:57   Ct Angio Chest Pe W/cm &/or Wo Cm  Result Date: 03/29/2018 CLINICAL DATA:  Shortness of breath and chest pain EXAM: CT ANGIOGRAPHY CHEST WITH CONTRAST TECHNIQUE: Multidetector CT imaging of the chest was performed using the standard protocol during bolus administration of intravenous contrast. Multiplanar CT image reconstructions and MIPs were obtained to evaluate the vascular anatomy. CONTRAST:  ISOVUE-370 IOPAMIDOL (ISOVUE-370) INJECTION 76% COMPARISON:  Chest CT July 24, 2014 and chest radiograph March 29, 2018 FINDINGS: Cardiovascular: There is no demonstrable pulmonary embolus. The ascending thoracic aortic diameter is 4.1 x 4.0 cm. There is no evident thoracic aortic dissection. The visualized great vessels appear unremarkable. Note that the right innominate and common carotid arteries arise as a common trunk, an anatomic variant. There is left ventricular hypertrophy. There is no pericardial effusion or pericardial thickening. The main pulmonary outflow tract measures 3.2 cm in diameter, prominent. Mediastinum/Nodes:  Visualized thyroid appears mildly inhomogeneous without dominant mass. There is no appreciable thoracic adenopathy. No esophageal lesions are demonstrable. Lungs/Pleura: There is mosaic attenuation in the upper lobes, particularly in the right upper lobe, likely due to small airways obstructive disease. Early developing pneumonia in the right upper lobe is possible. There is a degree of atelectasis in this area. There is slight bibasilar atelectasis. There is no pleural effusion or pleural thickening evident. Upper Abdomen: Visualized upper abdominal structures appear unremarkable. Musculoskeletal: There is degenerative change in the lower thoracic region. There are no blastic or lytic bone lesions. No chest wall lesions are evident. Review of the MIP images confirms the above findings. IMPRESSION: 1.  No demonstrable pulmonary embolus. 2. Prominence of the ascending thoracic aorta with a measured diameter 4.1 x 4.0 cm. No evident dissection. Recommend annual imaging followup by CTA or MRA. This recommendation follows 2010 ACCF/AHA/AATS/ACR/ASA/SCA/SCAI/SIR/STS/SVM Guidelines for the Diagnosis and Management of Patients with Thoracic Aortic Disease. Circulation. 2010; 121: Z610-R604. 3.  Left ventricular hypertrophy. 4. Prominence of the main pulmonary outflow tract, a finding felt to be indicative of a degree of pulmonary arterial hypertension. 5. 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 There may be early developing pneumonia in the right upper lobe. Areas of patchy atelectasis bilaterally noted. 6.  No evident thoracic adenopathy. 7. Thyroid mildly inhomogeneous in appearance without dominant mass evident. Electronically Signed   By: Bretta Bang III M.D.   On: 03/29/2018 13:11   Vas [place] Lower Extremity Venous (dvt) (only Mc & Wl 7a-7p)  Result Date: 03/29/2018  Lower Venous Study Indications: Swelling.  Comparison Study: Prior study from 07/24/14 is available for  comparison Performing Technologist: Sherren Kerns RVS  Examination Guidelines: A complete evaluation includes B-mode imaging, spectral Doppler, color Doppler, and power Doppler as needed of all accessible portions of each vessel. Bilateral testing is considered an integral part of a complete examination. Limited examinations for reoccurring indications may be performed as noted.  Right Venous Findings: +---------+---------------+---------+-----------+----------+-------+          CompressibilityPhasicitySpontaneityPropertiesSummary +---------+---------------+---------+-----------+----------+-------+ CFV      Full           Yes      Yes                          +---------+---------------+---------+-----------+----------+-------+ SFJ      Full                                                 +---------+---------------+---------+-----------+----------+-------+  FV Prox  Full                                                 +---------+---------------+---------+-----------+----------+-------+ FV Mid   Full                                                 +---------+---------------+---------+-----------+----------+-------+ FV DistalFull                                                 +---------+---------------+---------+-----------+----------+-------+ PFV      Full                                                 +---------+---------------+---------+-----------+----------+-------+ POP      Full           Yes      Yes                          +---------+---------------+---------+-----------+----------+-------+ PERO     Full                                                 +---------+---------------+---------+-----------+----------+-------+  Left Venous Findings: +---+---------------+---------+-----------+----------+-------+    CompressibilityPhasicitySpontaneityPropertiesSummary +---+---------------+---------+-----------+----------+-------+ CFVFull            Yes      Yes                          +---+---------------+---------+-----------+----------+-------+    Summary: Right: There is no evidence of deep vein thrombosis in the lower extremity. Left: No evidence of common femoral vein obstruction.  *See table(s) above for measurements and observations.    Preliminary     Procedures Procedures (including critical care time)  Medications Ordered in ED Medications  nitroGLYCERIN (NITROSTAT) SL tablet 0.4 mg (0.4 mg Sublingual Given 03/29/18 1118)  iopamidol (ISOVUE-370) 76 % injection (has no administration in time range)  enoxaparin (LOVENOX) injection 40 mg (has no administration in time range)  acetaminophen (TYLENOL) tablet 650 mg (has no administration in time range)    Or  acetaminophen (TYLENOL) suppository 650 mg (has no administration in time range)  senna-docusate (Senokot-S) tablet 1 tablet (has no administration in time range)  promethazine (PHENERGAN) tablet 12.5 mg (has no administration in time range)  labetalol (NORMODYNE,TRANDATE) injection 10 mg (has no administration in time range)  aspirin chewable tablet 324 mg (324 mg Oral Given 03/29/18 1110)  acetaminophen (TYLENOL) tablet 650 mg (650 mg Oral Given 03/29/18 1114)  iopamidol (ISOVUE-370) 76 % injection 100 mL (100 mLs Intravenous Contrast Given 03/29/18 1243)     Initial Impression / Assessment and Plan / ED Course  I have reviewed the triage vital signs and the nursing notes.  Pertinent labs & imaging results that were  available during my care of the patient were reviewed by me and considered in my medical decision making (see chart for details).     Work-up for PE does not show an obvious PE or DVT.  However the thoracic aorta is noted to be mildly enlarged without aneurysm.  With her chest pain and hypertensive urgency and no blood pressure control at home, her HEART score a 5, I think she will need admission for ACS rule out and hypertension control.  Internal  medicine teaching service to admit.  Final Clinical Impressions(s) / ED Diagnoses   Final diagnoses:  Hypertensive urgency  Nonspecific chest pain    ED Discharge Orders    None       Pricilla Loveless, MD 03/29/18 [phone]

## 2018-03-29 NOTE — Progress Notes (Signed)
VASCULAR LAB PRELIMINARY  PRELIMINARY  PRELIMINARY  PRELIMINARY  Right lower extremity venous duplex completed.    Preliminary report:  There is no DVT or SVT noted in the right lower extremity.  Called results to Dr. Meriam SpragueGoldston  Kaeley Vinje, [facility]AND[facility], RVT 03/29/2018, 2:27 PM

## 2018-03-29 NOTE — ED Triage Notes (Signed)
Pt c/o left leg swelling that started yesterday. Per pt, left leg has been tight and painful. Pt endorses SOB and chest soreness on left side.

## 2018-03-29 NOTE — ED Notes (Signed)
Pt denies any chest pain at present- will hold NTG -- c/o headache 5/10 - has had headache for past 2-3 days.

## 2018-03-29 NOTE — H&P (Signed)
Date: 03/29/2018               Patient Name:  Maria Greene MRN: [id number]  DOB: [birth year] Age / Sex: 57 y.o., female   PCP: Patient, No Pcp Per         Medical Service: Internal Medicine Teaching Service         Attending Physician: Dr. Inez CatalinaMullen, Emily B, MD    First Contact: Dr. Petra KubaVogel Pager: [phone]  Second Contact: Dr. Delma Officerhundi Pager: [phone]       After Hours (After 5p/  First Contact Pager: [phone]  weekends / holidays): Second Contact Pager: [phone]   Chief Complaint: Chest pain, sob  History of Present Illness:   [name] is a 57 y.o female with uncontrolled hypertension who presented with sob and chest pain that started at 3 or 4am 03/29/18 when she was resting. Describes the chest pain as pressure in the left chest. Lasting a couple minutes and resolving on its own. She thought she was feeling better so she got up to do the dishes and the sob returned, also with new wheezing. That is what prompted her to go to the ED.      Further questioning reveals more severe headaches the last 2-3 days that have been constant, located in bilateral temporal and occipital region with one episode of blurry vision a couple days ago. Denies associated n/v, abdominal pain. She states that she used to be on BP medication but ran out of it 3 months ago. She has been taking motrin BID for the past year and Excedrin and BC powders for her headaches.   She also notes intermittent bilateral leg swelling that is worse in her right leg. She had an arthroscopic right knee surgery 12 years ago.   She denies orthopnea, PND, pleuritic chest pain.    Meds:  Excedrin migraine Motrin BID BC powders  Previously prescribed lisinopril-HCTZ and metoprolol but no longer taking these  Allergies: Allergies as of 03/29/2018 - Review Complete 03/29/2018  Allergen Reaction Noted  . Percocet [oxycodone-acetaminophen] Nausea And Vomiting 03/07/2014   Past Medical History:  Diagnosis Date  . Hypertension      Family History: History reviewed. No pertinent family history.  Social History: Lives with daughter and grandchild. Works as Electrical engineersecurity guard. Denies tobacco, alcohol, or illicit drug use.   Review of Systems: A complete ROS was negative except as per HPI.   Physical Exam: Blood pressure (!) 187/99, pulse 86, temperature 98 F (36.7 C), temperature source Oral, resp. rate 18, height 5\' 2"  (1.575 m), weight 125.6 kg, SpO2 96 %. Vitals:   03/29/18 1600 03/29/18 1615 03/29/18 1641 03/29/18 1732  BP: (!) 203/107 (!) 214/116 (!) 212/114 (!) 187/99  Pulse: 91 90 92 86  Resp: 18  18 18   Temp:   98.2 F (36.8 C) 98 F (36.7 C)  TempSrc:   Oral Oral  SpO2: 96% 97% 95% 96%  Weight:      Height:       General: Vital signs reviewed.  Patient is well-developed and well-nourished, in no acute distress and cooperative with exam.  Head: Normocephalic and atraumatic. Eyes: EOMI, conjunctivae normal, no scleral icterus.  Neck: Supple, trachea midline, normal ROM, no JVD, masses, thyromegaly, or carotid bruit present.  Cardiovascular: RRR, S1 normal, S2 normal, no murmurs, gallops, or rubs. Pulmonary/Chest: Clear to auscultation bilaterally, no wheezes, rales, or rhonchi. Abdominal: Soft, non-tender, non-distended, BS +, no masses, organomegaly, or guarding present.  Musculoskeletal:  No joint deformities, erythema, or stiffness, ROM full and nontender. Extremities: No lower extremity edema bilaterally,  pulses symmetric and intact bilaterally. No cyanosis or clubbing. Neurological: A&O x3, Strength is normal and symmetric bilaterally, cranial nerve II-XII are grossly intact, no focal motor deficit, sensory intact to light touch bilaterally.  Skin: Warm, dry and intact. No rashes or erythema. Psychiatric: Normal mood and affect. speech and behavior is normal. Cognition and memory are normal.    EKG: personally reviewed my interpretation is no evidence of LVH or ischemia   CXR: personally  reviewed my interpretation is no pulmonary opacities or effusions  CTA chest: no PE or pulmonary edema, +LVH, prominent ascending thoracic aorta, prominence of main pulmonary outflow tract suggestive of PAH  Assessment & Plan by Problem: Principal Problem:   Hypertensive urgency Active Problems:   Dyspnea  [name] is a 57 y.o female with uncontrolled hypertension and obesity who presented with acute onset sob and chest pain in the setting of worsening headaches and uncontrolled HTN.   Hypertension: Likely contributing to her headaches. BP 180-200s/100-120s, hr=90-100, resp19-23. Alert and oriented. No signs of end organ damage.  - start amlodipine 10mg  daily  - prn labetalol injection  - if SBP > 180, DBP <105 then consider starting IV labetalol drip   Chest Pain: Typical features with substernal pressure. D-dimer slightly elevated to 1.06 but CTA chest negative for PE and lower extremity US negative for DVT.  Initial troponin negative and EKG without ischemia. No chest pain during my evaluation. Significant BC powder and motrin use raise concern for GI ulcer contributing to her pain.  - Heart score 2 for HTN and age.  - s/p aspirin  - trend troponins q6h - trial GI cocktail  - FOBT  Dyspnea: Exertional. No tobacco use. Lungs are CTAB. O2 sat >90% on RA. Denies orthopnea or PND and no signs of volume overload on exam although JVD is hard to assess. BNP 63. Doubt CHF. Evidence of LVH on CT chest and prominence of main pulmonary outflow tract suggestive of PAH which could cause sob.  - echo   DVT ppx: lovenox   Dispo: Admit patient to Observation with expected length of stay less than 2 midnights.  Signed: Ali Lowe, MD 03/29/2018, 6:35 PM  Pager: [phone]

## 2018-03-29 NOTE — ED Notes (Signed)
BP  Noted to Dr.Rand.

## 2018-03-29 NOTE — ED Notes (Signed)
MDAtbedsdie

## 2018-03-29 NOTE — Progress Notes (Signed)
Assumed care on pt.  Pt. resting with no distress/respirations unlabored , IV sites intact , denies pain , plan of care explained to pt.

## 2018-03-30 ENCOUNTER — Observation Stay (HOSPITAL_BASED_OUTPATIENT_CLINIC_OR_DEPARTMENT_OTHER): Payer: Self-pay

## 2018-03-30 DIAGNOSIS — I160 Hypertensive urgency: Secondary | ICD-10-CM

## 2018-03-30 DIAGNOSIS — E669 Obesity, unspecified: Secondary | ICD-10-CM

## 2018-03-30 DIAGNOSIS — I110 Hypertensive heart disease with heart failure: Secondary | ICD-10-CM

## 2018-03-30 DIAGNOSIS — Z79899 Other long term (current) drug therapy: Secondary | ICD-10-CM

## 2018-03-30 DIAGNOSIS — Z6841 Body Mass Index (BMI) 40.0 and over, adult: Secondary | ICD-10-CM

## 2018-03-30 DIAGNOSIS — I5020 Unspecified systolic (congestive) heart failure: Secondary | ICD-10-CM

## 2018-03-30 LAB — COMPREHENSIVE METABOLIC PANEL
ALK PHOS: 71 U/L (ref 38–126)
ALT: 11 U/L (ref 0–44)
ANION GAP: 8 (ref 5–15)
AST: 13 U/L — ABNORMAL LOW (ref 15–41)
Albumin: 3.2 g/dL — ABNORMAL LOW (ref 3.5–5.0)
BILIRUBIN TOTAL: 0.8 mg/dL (ref 0.3–1.2)
BUN: 15 mg/dL (ref 6–20)
CO2: 27 mmol/L (ref 22–32)
Calcium: 8.4 mg/dL — ABNORMAL LOW (ref 8.9–10.3)
Chloride: 105 mmol/L (ref 98–111)
Creatinine, Ser: 0.98 mg/dL (ref 0.44–1.00)
GFR calc Af Amer: >60 mL/min (ref >60)
GFR calc non Af Amer: >60 mL/min (ref >60)
Glucose, Bld: 111 mg/dL — ABNORMAL HIGH (ref 70–99)
Potassium: 3.2 mmol/L — ABNORMAL LOW (ref 3.5–5.1)
Sodium: 140 mmol/L (ref 135–145)
TOTAL PROTEIN: 7 g/dL (ref 6.5–8.1)

## 2018-03-30 LAB — CBC
HCT: 34.5 % — ABNORMAL LOW (ref 36.0–46.0)
Hemoglobin: 10.9 g/dL — ABNORMAL LOW (ref 12.0–15.0)
MCH: 25.3 pg — ABNORMAL LOW (ref 26.0–34.0)
MCHC: 31.6 g/dL (ref 30.0–36.0)
MCV: 80.2 fL (ref 80.0–100.0)
PLATELETS: 297 10*3/uL (ref 150–400)
RBC: 4.3 MIL/uL (ref 3.87–5.11)
RDW: 14.7 % (ref 11.5–15.5)
WBC: 9.9 10*3/uL (ref 4.0–10.5)
nRBC: 0 % (ref 0.0–0.2)

## 2018-03-30 LAB — LIPID PANEL
Cholesterol: 164 mg/dL (ref 0–200)
HDL: 38 mg/dL — ABNORMAL LOW (ref >40)
LDL Cholesterol: 109 mg/dL — ABNORMAL HIGH (ref 0–99)
Total CHOL/HDL Ratio: 4.3 RATIO
Triglycerides: 83 mg/dL (ref <150)
VLDL: 17 mg/dL (ref 0–40)

## 2018-03-30 LAB — HEMOGLOBIN A1C
Hgb A1c MFr Bld: 5.6 % (ref 4.8–5.6)
Mean Plasma Glucose: 114.02 mg/dL

## 2018-03-30 LAB — ECHOCARDIOGRAM COMPLETE
Height: 62 in
Weight: 4391.56 oz

## 2018-03-30 LAB — TROPONIN I
Troponin I: <0.03 ng/mL (ref <0.03)
Troponin I: <0.03 ng/mL (ref <0.03)

## 2018-03-30 LAB — HIV ANTIBODY (ROUTINE TESTING W REFLEX): HIV Screen 4th Generation wRfx: NONREACTIVE

## 2018-03-30 MED ORDER — LISINOPRIL 5 MG PO TABS: 5.0000 mg | ORAL_TABLET | Freq: Every day | ORAL | 0 refills | Status: DC

## 2018-03-30 MED ORDER — FUROSEMIDE 20 MG PO TABS: 20.0000 mg | ORAL_TABLET | ORAL | 0 refills | Status: DC

## 2018-03-30 MED ORDER — CARVEDILOL 3.125 MG PO TABS: 3.1250 mg | ORAL_TABLET | Freq: Two times a day (BID) | ORAL | 0 refills | Status: DC

## 2018-03-30 MED ORDER — LABETALOL HCL 5 MG/ML IV SOLN: 5.0000 mg | Freq: Once | INTRAVENOUS | Status: DC

## 2018-03-30 MED ORDER — ATORVASTATIN CALCIUM 20 MG PO TABS: 20.0000 mg | ORAL_TABLET | Freq: Every day | ORAL | 0 refills | Status: DC

## 2018-03-30 MED ORDER — AMLODIPINE BESYLATE 10 MG PO TABS: 10.0000 mg | ORAL_TABLET | Freq: Every day | ORAL | 0 refills | Status: DC

## 2018-03-30 MED ORDER — LISINOPRIL 5 MG PO TABS
5.0000 mg | ORAL_TABLET | Freq: Every day | ORAL | Status: DC
  Administered 2018-03-30: 5 mg via ORAL
  Filled 2018-03-30: qty 1

## 2018-03-30 MED ORDER — LABETALOL HCL 5 MG/ML IV SOLN
5.0000 mg | Freq: Once | INTRAVENOUS | Status: AC
  Administered 2018-03-30: 5 mg via INTRAVENOUS
  Filled 2018-03-30: qty 4

## 2018-03-30 MED ORDER — POTASSIUM CHLORIDE CRYS ER 20 MEQ PO TBCR
40.0000 meq | EXTENDED_RELEASE_TABLET | Freq: Once | ORAL | Status: AC
  Administered 2018-03-30: 40 meq via ORAL
  Filled 2018-03-30: qty 2

## 2018-03-30 NOTE — Progress Notes (Signed)
EKG completed. MD notified of results. EKG placed in Medical Records drawer.

## 2018-03-30 NOTE — Progress Notes (Addendum)
Subjective: This morning, at time of patient interaction,  Maria Greene reports that her headache, chest pain, and shortness of breath have resolved.  She does endorse a headache earlier this morning that resolved with administration of morning medications (lisnopril, acetaminophen).    Objective: Vital signs in last 24 hours: Vitals:   03/30/18 0416 03/30/18 0618 03/30/18 0646 03/30/18 0815  BP: (!) 157/100 (!) 188/111 (!) 165/90 (!) 154/90  Pulse: 78 85    Resp: 20 18    Temp: 98.5 F (36.9 C)     TempSrc: Oral     SpO2: 95% 93%    Weight:   124.5 kg   Height:       Physical Exam General: nad, resting upright in hospital bed, alert and cooperative Cardiovascular: RRR, Hr to the 80's S1 normal, S2 normal, no murmurs, gallops, or rubs. Pulmonary/Chest: LCTAB, no wheezes, rales, or rhonchi. breathing unlabored Abdominal: non-tender to palpation. Extremities: Trace lower extremity edema in right leg. No lower extremity edema in left leg. Psychiatric: appropriate affect  Assessment/Plan: Principal Problem:   Hypertensive urgency Active Problems:   Dyspnea  Maria Greene is a 57 y.o female with uncontrolled hypertension and obesity who presented with acute onset sob and chest pain in the setting of worsening headaches and found to have an unresolved t-wave inversion (likley secondary to ischemia) on EKG, no other evidence of end organ damage from her hypertension, and CT imaging findings concerning for pulmonary arterial hypertension.   Headache, controlled: Resolves with acetaminophen, and likely confounded with hypertensive urgency.   -CTM - consider scheduled acetaminophen +/- tramadol for rebound headaches from discontinuing chronic nsaid use  Hypertensive Urgency, improving: 154/90 this am. No evidence of end organ damage on admission. ED course includes 10mg  dose of labetalol at 4pm 9Dec, 5mg  dose of labetalol at 1am 10Dec - amlodipine 10mg  daily  - lisinopril 5mg   - prn labetalol  injection;  if SBP > 180, DBP <105 then consider starting IV labetalol drip   Chest Pain, resolved:  ED course remarkable for 325mg  aspirin tablet.  It is less likely that Maria Greene experienced an acute coronary event as initial troponin was negative and the only  ischemic changes on EKG were T wave inversions in lateral leads V5-V6 (f/u EKG today remarkable for lateral lead T wave inversions still)   Significant BC powder and motrin use raise concern for duodenal ulcer/PUD/gastrointestinal ulcer contributing to her pain at admission  but H/H is stable, and there is no evidence of GI bleed (FOBT negative, and pt denies hematochezia, hematoemesis). CT  Imaging was remarkable for pulmonary outflow tract obstruction concerning for pulmonary arterial hypertension, and this diagnosis can be a contributing etiology.  In the setting of uncontrolled hypertension, aortic aneurysm and/or dissection were under consideration but CT imaging was remarkable for a prominent ascending thoracic aorta and reassuring against dissection.  Vascular etiologies, such as pulmonary embolism, is under consideration given that Maria Greene's  D-dimer was slightly elevated on admission to 1.06.  However, CTA chest was negative for PE and lower extremity US was negative for DVT.   Chest pain was not pleuretic in nature and patient did not display any analagous systemic symptoms, thus pulmonary infection is also less likely diagnosis on the differential    - Heart score 2 for HTN and age (Approx 2.5% risk of acute MI, PCI, CABG, or death)  - trend troponins q6h  Dyspnea, resolved:Marland Kitchen. Under consideration were vascular etiologies, and given the CT findings PAH could be contributing.  Exertional dyspnea is also an etiology that cannot be ruled out.  No tobacco use, LCTAB on physical exam, and SpO2 93-100% on room air, makes acute changes in Maria Greene's obstructive pulmonary process or alveolar/parenchymal disease less likely. CT remarkable for small  airway obstructive disease in upper lobes R>L and patchy atelectasis (presumably chronic?).  While it does not fit with the HPI, cannot rule out anxiety as an etiology.  Patient denies orthopnea or PND, has no signs of volume overload on exam this AM and  BNP wnl, thus CHF another less likely etiology.  - echo today 10 December  FEN/GI: Asymptomatic hypokalemic on admission at 3.2 -replete electrolytes as needed - Heart Healthy Diet   DVT ppx: lovenox    This is a Psychologist, occupational Note.  The care of the patient was discussed with Dr. Criselda Peaches  and the assessment and plan formulated with their assistance.  Please see their attached note for official documentation of the daily encounter.   LOS: 0 days   Chukwu, Chika I, Medical Student 03/30/2018, 11:10 AM   Attestation for Student Documentation:  I personally was present and performed or re-performed the history, physical exam and medical decision-making activities of this service and have verified that the service and findings are accurately documented in the student's note.  Ali Lowe, MD 03/30/2018, 11:20 AM [phone]  Dispo: Anticipated discharge today after echo and 3rd troponin. F/u has been arranged in our outpatient clinic

## 2018-03-30 NOTE — Discharge Summary (Signed)
Name: Maria LameRobin Greene MRN: [id number] DOB: [birth year] 57 y.o. PCP: Patient, No Pcp Per  Date of Admission: 03/29/2018  9:29 AM Date of Discharge: 03/30/18 Attending Physician: Inez CatalinaMullen, Emily B, MD  Discharge Diagnosis: 1. Hypertensive  Urgency 2. Heart Failure with Reduced Ejection Fraction  Discharge Medications: Allergies as of 03/30/2018      Reactions   Percocet [oxycodone-acetaminophen] Nausea And Vomiting      Medication List    STOP taking these medications   hydrochlorothiazide 25 MG tablet Commonly known as:  HYDRODIURIL     TAKE these medications   amLODipine 10 MG tablet Commonly known as:  NORVASC Take 1 tablet (10 mg total) by mouth daily. Start taking on:  03/31/2018   atorvastatin 20 MG tablet Commonly known as:  LIPITOR Take 1 tablet (20 mg total) by mouth daily.   carvedilol 3.125 MG tablet Commonly known as:  COREG Take 1 tablet (3.125 mg total) by mouth 2 (two) times daily with a meal.   furosemide 20 MG tablet Commonly known as:  LASIX Take 1 tablet (20 mg total) by mouth every other day. IM Teaching Program   lisinopril 5 MG tablet Commonly known as:  PRINIVIL,ZESTRIL Take 1 tablet (5 mg total) by mouth daily. Start taking on:  03/31/2018      Disposition and follow-up:   [name] was discharged from [facility] in Good condition.  At the hospital follow up visit please address:  Needs to establish care *Address adherence and affordability of new medicines (lisinopril, amlodipine, coreg, lasix, furosemide). Continue CHF education. Has cardiology appt 05/12/18.   2.  Labs / imaging needed at time of follow-up: none  3.  Pending labs/ test needing follow-up: Will likely need left heart cath given hypokinesis on echo  Follow-up Appointments: Follow-up Information    [facility]. Go on 04/06/2018.   Why:  9:45AM Contact information: [address] [place]  [phone]       [facility] Follow up on 05/12/2018.   Why:  8:20am Contact information: [address] [place] [phone]         Hospital Course by problem list:  [name] is a 57 y.o femalewith uncontrolledhypertensionand obesitywho presented with acute onset sobandchest pain in the setting of worsening headaches and found to have an unresolved t-wave inversion (likley secondary to ischemia) on EKG, no other evidence of end organ damage from her hypertension, and CT imaging findings concerning for pulmonary arterial hypertension.    1.Heart Failure with Reduced Ejection Fraction: Newly diagnosed. In the setting of uncontrolled HTN. Presented with sob, wheezing, chest pain, lower extremity swelling.  Echo revealed EF 45-50%, g2dd, mild diffuse hypokinesis.  [name] was discharged on lasix 20mg  every other day, coreg bid and a statin.  3. Hypertensive Urgency:  Improved at time of discharge but still elevated. SBP ranged 180-200s and DBP range 110-120s on day of admission. +headache but  No signs of end organ damage. She was discharged on 10mg  of amlodopine daily, and  5mg  lisonopril daily.   4. Chest Pain: Elevated d-dimer but CTA was negative for PE and lower extremity dopplers negative for DVT. Initial and subsequent  troponins were within normal limits and the only  ischemic changes on EKG were T wave inversions in lateral leads V5-V6 (f/u EKG today remarkable for lateral lead T wave inversions still).  Discharged with cardiology outpatient f/u for  possible left heart cath.   ?Pulmonary Hypertension: CT Imaging was remarkable for prominence of the main pulmonary outflow tract, a finding felt to be indicative of a degree of pulmonary arterial hypertension. Unable to measure PAH on echo.     Discharge Vitals:   BP (!) 161/112 (BP Location: Left Wrist)   Pulse 96   Temp 98.5 F  (36.9 C) (Oral)   Resp 20   Ht 5\' 2"  (1.575 m)   Wt 124.5 kg   SpO2 96%   BMI 50.20 kg/m   Pertinent Labs, Studies, and Procedures:   Echo: - Left ventricle: The cavity size was normal. There was mild   concentric hypertrophy. Systolic function was mildly reduced. The   estimated ejection fraction was in the range of 45% to 50%. Mild   diffuse hypokinesis with no identifiable regional variations.   Features are consistent with a pseudonormal left ventricular   filling pattern, with concomitant abnormal relaxation and   increased filling pressure (grade 2 diastolic dysfunction). - Aortic valve: There was trivial regurgitation. - Left atrium: The atrium was mildly dilated. - Pericardium, extracardiac: A trivial pericardial effusion was   identified. - Unable to measure PAH  CTA chest: 1.  No demonstrable pulmonary embolus.  2. Prominence of the ascending thoracic aorta with a measured diameter 4.1 x 4.0 cm. No evident dissection. Recommend annual imaging followup by CTA or MRA. This recommendation follows 2010 ACCF/AHA/AATS/ACR/ASA/SCA/SCAI/SIR/STS/SVM Guidelines for the Diagnosis and Management of Patients with Thoracic Aortic Disease. Circulation. 2010; 121: A213-Y865.  3.  Left ventricular hypertrophy.  4. Prominence of the main pulmonary outflow tract, a finding felt to be indicative of a degree of pulmonary arterial hypertension.  5. 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 There may be early developing pneumonia in the right upper lobe. Areas of patchy atelectasis bilaterally noted.  6.  No evident thoracic adenopathy.  7. Thyroid mildly inhomogeneous in appearance without dominant mass evident.   9 December CHEST XRAY IMPRESSION: No active cardiopulmonary disease. Electronically Signed   By: Charlett Nose M.D.  9 December VAS [place] LOWER EXTREMITY VENOUS Summary: Right: There is no evidence of deep vein thrombosis in  the lower extremity. Left: No evidence of common femoral vein obstruction. Electronically signed by Lemar Livings MD  Discharge Instructions: Discharge Instructions    Diet - low sodium heart healthy   Complete by:  As directed    Discharge instructions   Complete by:  As directed    Maria Greene,  You were admitted to the hospital because you were having trouble breathing and some chest pain. We found out that your heart isn't pumping as efficiently as it used to, we call this Congestive Heart Failure. This is in part a consequence of your blood pressure being too high for too long. There are several medicines that we are starting you on in order to get you as healthy as possible and protect your heart from anymore damage. The new medicines are listed below:  - Lisinopril (blood pressure) - Amlodipine (blood pressure) - Atorvastatin (cholesterol) - Carvedilol (blood pressure and heart rate). This will help decrease the amount of stress on your heart - Furosemide (diuretic or "water pill" to help you pee out any extra fluid). Take this as needed when you notice you are feeling short of breath, gaining, weight, or have more swelling in you legs/arm/abdomen.   We have made you a hospital follow up with Dr. Criselda Peaches in our  Internal Medicine Clinic located on the ground floor of [facility]. If you have any questions about your hospital stay or need to reschedule your appointment, please call the clinic at [phone].   We would also like you to see the cardiologist (heart doctor) to further investigate the health of your heart. It is possible you will need a heart catheter at some point in the future to look at the blood vessels that supply your heart.   Increase activity slowly   Complete by:  As directed       Signed: Ali Lowe, MD 03/30/2018, 3:29 PM   Pager: [phone]

## 2018-03-30 NOTE — Discharge Instructions (Signed)
Heart Failure °Heart failure means your heart has trouble pumping blood. This makes it hard for your body to work well. Heart failure is usually a long-term (chronic) condition. You must take good care of yourself and follow your doctor's treatment plan. °Follow these instructions at home: °· Take your heart medicine as told by your doctor. °? Do not stop taking medicine unless your doctor tells you to. °? Do not skip any dose of medicine. °? Refill your medicines before they run out. °? Take other medicines only as told by your doctor or pharmacist. °· Stay active if told by your doctor. The elderly and people with severe heart failure should talk with a doctor about physical activity. °· Eat heart-healthy foods. Choose foods that are without trans fat and are low in saturated fat, cholesterol, and salt (sodium). This includes fresh or frozen fruits and vegetables, fish, lean meats, fat-free or low-fat dairy foods, whole grains, and high-fiber foods. Lentils and dried peas and beans (legumes) are also good choices. °· Limit salt if told by your doctor. °· Cook in a healthy way. Roast, grill, broil, bake, poach, steam, or stir-fry foods. °· Limit fluids as told by your doctor. °· Weigh yourself every morning. Do this after you pee (urinate) and before you eat breakfast. Write down your weight to give to your doctor. °· Take your blood pressure and write it down if your doctor tells you to. °· Ask your doctor how to check your pulse. Check your pulse as told. °· Lose weight if told by your doctor. °· Stop smoking or chewing tobacco. Do not use gum or patches that help you quit without your doctor's approval. °· Schedule and go to doctor visits as told. °· Nonpregnant women should have no more than 1 drink a day. Men should have no more than 2 drinks a day. Talk to your doctor about drinking alcohol. °· Stop illegal drug use. °· Stay current with shots (immunizations). °· Manage your health conditions as told by your  doctor. °· Learn to manage your stress. °· Rest when you are tired. °· If it is really hot outside: °? Avoid intense activities. °? Use air conditioning or fans, or get in a cooler place. °? Avoid caffeine and alcohol. °? Wear loose-fitting, lightweight, and light-colored clothing. °· If it is really cold outside: °? Avoid intense activities. °? Layer your clothing. °? Wear mittens or gloves, a hat, and a scarf when going outside. °? Avoid alcohol. °· Learn about heart failure and get support as needed. °· Get help to maintain or improve your quality of life and your ability to care for yourself as needed. °Contact a doctor if: °· You gain weight quickly. °· You are more short of breath than usual. °· You cannot do your normal activities. °· You tire easily. °· You cough more than normal, especially with activity. °· You have any or more puffiness (swelling) in areas such as your hands, feet, ankles, or belly (abdomen). °· You cannot sleep because it is hard to breathe. °· You feel like your heart is beating fast (palpitations). °· You get dizzy or light-headed when you stand up. °Get help right away if: °· You have trouble breathing. °· There is a change in mental status, such as becoming less alert or not being able to focus. °· You have chest pain or discomfort. °· You faint. °This information is not intended to replace advice given to you by your health care provider. Make sure you   discuss any questions you have with your health care provider. °Document Released: 01/15/2008 Document Revised: 09/13/2015 Document Reviewed: 05/24/2012 °Elsevier Interactive Patient Education © 2017 Elsevier Inc. ° °

## 2018-03-30 NOTE — Progress Notes (Signed)
  Echocardiogram 2D Echocardiogram has been performed.  Maria SavoyCasey N [name] 03/30/2018, 11:27 AM

## 2018-03-30 NOTE — Care Management Note (Addendum)
Case Management Note  Patient Details  Name: Maria Greene MRN: [id number] Date of Birth: [birth year]  Subjective/Objective:  57 yo female presented with SOB, CP, with elevated BP.               Action/Plan: CM met with patient to discuss transitional needs. Patient lives at home, independent with ADLs PTA. Patient reported having no established PCP, with patient to f/u at [facility]. Patient verbalized having enrolled in health insurance with her employer, but unsure of start date, and no money until Friday for her Rx. Patient screened for [facility], with [place] letter emailed to [place] for $0 copay. CM discussed [facility], with patient verbalizing understanding to f/u for future Rx refills; AVS updated. Patient stated her daughter will provide transportation home. No further needs from CM.    Expected Discharge Date:  03/30/18               Expected Discharge Plan:  Home/Self Care  In-House Referral:  NA  Discharge planning Services  CM Consult, Medication Assistance, [place] [facility]  Post Acute Care Choice:  NA Choice offered to:  NA  DME Arranged:  N/A DME Agency:  NA  HH Arranged:  NA HH Agency:     Status of Service:  Completed, signed off  If discussed at [place] of Stay Meetings, dates discussed:    Additional Comments: 03/30/18 @ 1549-Leshae Mcclay RNCM-CM informed by [facility] pharmacy that patient has active Medicare Part D, and wouldn't qualify for MATCH. CM updated Dr. Donne Hazel with Rx to be sent to [facility] outpatient pharmacy.   Midge Minium RN, BSN, NCM-BC, ACM-RN [phone] 03/30/2018, 3:25 PM

## 2018-04-06 ENCOUNTER — Other Ambulatory Visit: Payer: Self-pay

## 2018-04-06 ENCOUNTER — Encounter: Payer: Self-pay | Admitting: Internal Medicine

## 2018-04-06 ENCOUNTER — Ambulatory Visit (INDEPENDENT_AMBULATORY_CARE_PROVIDER_SITE_OTHER): Payer: Self-pay | Admitting: Internal Medicine

## 2018-04-06 VITALS — BP 152/86 | HR 91 | Temp 98.0°F | Ht 61.0 in | Wt 273.7 lb

## 2018-04-06 DIAGNOSIS — Z90711 Acquired absence of uterus with remaining cervical stump: Secondary | ICD-10-CM

## 2018-04-06 DIAGNOSIS — Z9049 Acquired absence of other specified parts of digestive tract: Secondary | ICD-10-CM

## 2018-04-06 DIAGNOSIS — I110 Hypertensive heart disease with heart failure: Secondary | ICD-10-CM

## 2018-04-06 DIAGNOSIS — I5032 Chronic diastolic (congestive) heart failure: Secondary | ICD-10-CM

## 2018-04-06 DIAGNOSIS — Z96651 Presence of right artificial knee joint: Secondary | ICD-10-CM

## 2018-04-06 DIAGNOSIS — I10 Essential (primary) hypertension: Secondary | ICD-10-CM

## 2018-04-06 HISTORY — DX: Essential (primary) hypertension: I10

## 2018-04-06 LAB — POCT URINALYSIS DIPSTICK
Glucose, UA: NEGATIVE
Leukocytes, UA: NEGATIVE
Nitrite, UA: NEGATIVE
PH UA: 6 (ref 5.0–8.0)
Protein, UA: POSITIVE — AB
RBC UA: NEGATIVE
Spec Grav, UA: 1.025 (ref 1.010–1.025)
Urobilinogen, UA: 0.2 E.U./dL

## 2018-04-06 MED ORDER — CHLORTHALIDONE 25 MG PO TABS: 25.0000 mg | ORAL_TABLET | Freq: Every day | ORAL | 3 refills | Status: DC

## 2018-04-06 MED ORDER — LISINOPRIL 10 MG PO TABS: 10.0000 mg | ORAL_TABLET | Freq: Every day | ORAL | 1 refills | Status: DC

## 2018-04-06 NOTE — Assessment & Plan Note (Signed)
HPI: During the patient's hospitalization for hypertensive urgency transthoracic echocardiogram was obtained that illustrated left ventricular ejection fraction of 45 to 50%, concentric left ventricular hypertrophy, grade 2 diastolic dysfunction, and left atrial enlargement. She was subsequently started on therapy for HFrEF and scheduled for cardiology follow-up in January. Patient has not started any of these medications since being discharged. She denies dyspnea on exertion, chest pain, lower extremity edema, increased abdominal distention, abdominal pain, nausea/vomiting.  Assessment/plan: Based on the patient's echocardiogram I feel that she likely has HFpEF secondary to prolonged hypertension. I do not feel that she needs therapy for heart failure with reduced ejection fraction we should therefore focus on treating her hypertension. She does have a follow-up scheduled with cardiology in January who can further decide whether she needs ischemic evaluation. In the meantime we will treat her hypertension as outlined separately.

## 2018-04-06 NOTE — Progress Notes (Signed)
   CC: Hypertension  HPI:  [name] is a 57 y.o. female with PMHx listed below presenting for hospital follow-up after being diagnosed with hypertensive urgency. Please see the A&P for the status of the patient's chronic medical problems.  Past Medical History:  Diagnosis Date  . Hypertension    Surgical Hx: History of appendectomy, partial hysterectomy, and right knee arthroplasty  Family Hx: Family history significant for early CVA and hypertension in her mother and sister. Father with CAD.  Social Hx: originally from [place] and then moved to [place] before moving to [place] approximately six years ago. 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 She has two children, one daughter and one son. She has two grand children, both boys one age 718 and 1. She denies the use of tobacco, alcohol, or other illicit substances.  Review of Systems: Performed and all others negative.  Physical Exam: Vitals:   04/06/18 1029  BP: (!) 152/86  Pulse: 91  Temp: 98 F (36.7 C)  TempSrc: Oral  SpO2: 99%  Weight: 273 lb 11.2 oz (124.1 kg)  Height: 5\' 1"  (1.549 m)   General: Obese female in no acute distress HENT: Normocephalic, atraumatic, moist mucus membranes Pulm: Good air movement with no wheezing or crackles  CV: RRR, no murmurs, no rubs  Abdomen: Active bowel sounds, soft, non-distended, no tenderness to palpation  Extremities: Pulses palpable in all extremities, no LE edema  Skin: Warm and dry  Neuro: Alert and oriented x 3  Assessment & Plan:   See Encounters Tab for problem based charting.  Patient discussed with Dr. Cleda DaubE. Hoffman

## 2018-04-06 NOTE — Patient Instructions (Addendum)
Thank you for allowing us to provide your care. Today I will [facility]. One of these will be chlorthalidone 25 mg once daily. The other medication will depend on what her urine shows. It will likely be either amlodipine or lisinopril. I will call you tomorrow.  As we discussed exercise and weight loss will be highly beneficial for your blood pressure and your heart. We may be able to work towards getting you off medications completely, but in the meantime please continue all medications as prescribed.  Please come back to see us in three months or sooner if anything arises. I hope you have a blessed Christmas and New Year's.

## 2018-04-06 NOTE — Assessment & Plan Note (Addendum)
HPI: Patient presenting for hospital follow-up after being admitted for hypertensive urgency. She was diagnosed with hypertension 13 years ago and put on combination therapy with chlorthalidone and lisinopril. Her blood pressure was well controlled until approximately 2 to 3 months before hospitalization when she ran out of medications and did not have the PCP to refill them. She subsequently presented to the emergency department with chest pain and dyspnea. Blood pressure was found to be greater than 200/100. There was no evidence of end organ damage on labs and she was subsequently treated for hypertensive urgency. Echocardiogram during her hospitalization illustrated left ventricular ejection fraction of 45 to 50%. This is further discussed elsewhere. She was discharged on amlodipine 10 mg, carvedilol 3.125 mg BID, Lasix 20 mg q.d., and lisinopril 5 mg q.d. Since being discharged she has not been able to get her medications and has not started them. She denies visual changes, headaches, shortness of breath, dyspnea, abdominal pain.  Assessment/plan: Patient presenting for continued evaluation and management of her poorly controlled hypertension. She is not on any medical therapy at this point. Today we will start chlorthalidone 25 mg and obtain a point-of-care urine analysis. This does show that she has some proteinuria. We will therefore start lisinopril 10 mg daily. She will follow-up in three months for repeat BMP and blood pressure check.

## 2018-04-07 NOTE — Progress Notes (Signed)
Internal Medicine Clinic Attending  Case discussed with Dr. Helberg at the time of the visit.  We reviewed the resident's history and exam and pertinent patient test results.  I agree with the assessment, diagnosis, and plan of care documented in the resident's note.    

## 2018-05-12 ENCOUNTER — Ambulatory Visit: Payer: No Typology Code available for payment source | Admitting: Cardiology

## 2018-05-12 ENCOUNTER — Encounter: Payer: Self-pay | Admitting: *Deleted

## 2018-05-26 ENCOUNTER — Encounter: Payer: No Typology Code available for payment source | Admitting: Internal Medicine

## 2018-06-16 ENCOUNTER — Encounter: Payer: Self-pay | Admitting: Internal Medicine

## 2018-06-23 ENCOUNTER — Other Ambulatory Visit: Payer: Self-pay

## 2018-06-23 ENCOUNTER — Emergency Department (HOSPITAL_BASED_OUTPATIENT_CLINIC_OR_DEPARTMENT_OTHER): Payer: BLUE CROSS/BLUE SHIELD

## 2018-06-23 ENCOUNTER — Emergency Department (HOSPITAL_COMMUNITY)
Admission: EM | Admit: 2018-06-23 | Discharge: 2018-06-23 | Disposition: A | Discharge status: Home / Self Care | Payer: BLUE CROSS/BLUE SHIELD | Attending: Emergency Medicine | Admitting: Emergency Medicine

## 2018-06-23 DIAGNOSIS — M79609 Pain in unspecified limb: Secondary | ICD-10-CM

## 2018-06-23 DIAGNOSIS — M7122 Synovial cyst of popliteal space [Baker], left knee: Secondary | ICD-10-CM | POA: Insufficient documentation

## 2018-06-23 DIAGNOSIS — I10 Essential (primary) hypertension: Secondary | ICD-10-CM | POA: Diagnosis not present

## 2018-06-23 DIAGNOSIS — M79605 Pain in left leg: Secondary | ICD-10-CM

## 2018-06-23 DIAGNOSIS — Z79899 Other long term (current) drug therapy: Secondary | ICD-10-CM | POA: Diagnosis not present

## 2018-06-23 LAB — BASIC METABOLIC PANEL
Anion gap: 10 (ref 5–15)
BUN: 15 mg/dL (ref 6–20)
CALCIUM: 8.7 mg/dL — AB (ref 8.9–10.3)
CO2: 27 mmol/L (ref 22–32)
Chloride: 101 mmol/L (ref 98–111)
Creatinine, Ser: 1.16 mg/dL — ABNORMAL HIGH (ref 0.44–1.00)
GFR calc Af Amer: >60 mL/min (ref >60)
GFR calc non Af Amer: 53 mL/min — ABNORMAL LOW (ref >60)
Glucose, Bld: 154 mg/dL — ABNORMAL HIGH (ref 70–99)
Potassium: 3.2 mmol/L — ABNORMAL LOW (ref 3.5–5.1)
Sodium: 138 mmol/L (ref 135–145)

## 2018-06-23 LAB — CBC
HCT: 35.5 % — ABNORMAL LOW (ref 36.0–46.0)
Hemoglobin: 11.3 g/dL — ABNORMAL LOW (ref 12.0–15.0)
MCH: 26.8 pg (ref 26.0–34.0)
MCHC: 31.8 g/dL (ref 30.0–36.0)
MCV: 84.1 fL (ref 80.0–100.0)
PLATELETS: 361 10*3/uL (ref 150–400)
RBC: 4.22 MIL/uL (ref 3.87–5.11)
RDW: 14.9 % (ref 11.5–15.5)
WBC: 10.8 10*3/uL — ABNORMAL HIGH (ref 4.0–10.5)
nRBC: 0 % (ref 0.0–0.2)

## 2018-06-23 LAB — I-STAT CREATININE, ED: Creatinine, Ser: 1.3 mg/dL — ABNORMAL HIGH (ref 0.44–1.00)

## 2018-06-23 LAB — I-STAT TROPONIN, ED: Troponin i, poc: 0.01 ng/mL (ref 0.00–0.08)

## 2018-06-23 MED ORDER — HYDROCODONE-ACETAMINOPHEN 5-325 MG PO TABS: 1.0000 | ORAL_TABLET | Freq: Four times a day (QID) | ORAL | 0 refills | Status: DC | PRN

## 2018-06-23 MED ORDER — NAPROXEN 375 MG PO TABS: 375.0000 mg | ORAL_TABLET | Freq: Two times a day (BID) | ORAL | 0 refills | Status: DC

## 2018-06-23 NOTE — ED Triage Notes (Signed)
Patient reports L leg pain and swelling onset while at work today, denies recent injury. Patient does have history of CHF - denies shortness of breath or CP. Pain located behind the knee - patient endorses difficulty ambulating d/t pain. Skin w/d, resp e/u.

## 2018-06-23 NOTE — Discharge Instructions (Signed)
1.  Try to elevate and rest your leg with icing the knee as much as possible. 2.  Schedule appointment with the orthopedic doctor listed above. 3.  Take naproxen twice a day for the next 3 to 5 days to help with pain.  You need to have a recheck with your doctor before continuing any long-term use of anti-inflammatory medications such as naproxen.  It can affect your kidneys if you have problems with hypertension and kidney function.  You may take 1-2 Vicodin tablets as prescribed if needed for severe pain.

## 2018-06-23 NOTE — ED Provider Notes (Signed)
[facility] EMERGENCY DEPARTMENT Provider Note   CSN: [id number] Arrival date & time: 06/23/18  1155    History   Chief Complaint Chief Complaint  Patient presents with  . Leg Pain    HPI Maria Greene is a 58 y.o. female.     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  She had been up and walking around but not having difficulty.  She reports she sat down and when she went to get back up she had a really pretty sudden and severe pain behind her left knee.  She reports it hurt a lot behind the knee it radiated down towards her calf and a little bit up to the back of the leg.  She reports at that time then her leg felt really stiff and it became hard to walk on it.  She denies any numbness or tingling of the foot.  She was most worried that she might of gotten a blood clot.  She reports she does have a history of congestive heart failure.  No prior blood clot.  She denies any chest pain or shortness of breath. Past Medical History:  Diagnosis Date  . Dyspnea 03/29/2018  . Hypertension   . Hypertensive urgency 03/29/2018    Patient Active Problem List   Diagnosis Date Noted  . Essential hypertension 04/06/2018  . Chronic heart failure with preserved ejection fraction (HCC) 04/06/2018    Past Surgical History:  Procedure Laterality Date  . ABDOMINAL HYSTERECTOMY    . CHOLECYSTECTOMY    . KNEE ARTHROSCOPY    . TUBAL LIGATION       OB History   No obstetric history on file.      Home Medications    Prior to Admission medications   Medication Sig Start Date End Date Taking? Authorizing Provider  atorvastatin (LIPITOR) 20 MG tablet Take 1 tablet (20 mg total) by mouth daily. 03/30/18   Ali Lowe, MD  chlorthalidone (HYGROTON) 25 MG tablet Take 1 tablet (25 mg total) by mouth daily. 04/06/18   Levora Dredge, MD  HYDROcodone-acetaminophen (NORCO/VICODIN) 5-325 MG tablet Take 1 tablet by mouth every 6 (six) hours as needed for severe pain.  06/23/18   Arby Barrette, MD  lisinopril (PRINIVIL,ZESTRIL) 10 MG tablet Take 1 tablet (10 mg total) by mouth daily. 04/06/18   Levora Dredge, MD  naproxen (NAPROSYN) 375 MG tablet Take 1 tablet (375 mg total) by mouth 2 (two) times daily. 06/23/18   Arby Barrette, MD    Family History No family history on file.  Social History Social History   Tobacco Use  . Smoking status: Never Smoker  . Smokeless tobacco: Never Used  Substance Use Topics  . Alcohol use: No  . Drug use: No     Allergies   Percocet [oxycodone-acetaminophen]   Review of Systems Review of Systems  Constitutional: No fever no chills no malaise Respiratory: No cough no shortness of breath Cardiovascular: No chest pain Neurologic: No paresthesia no numbness Physical Exam Updated Vital Signs BP (!) 151/83 (BP Location: Right Arm)   Pulse 74   Temp 97.8 F (36.6 C) (Oral)   Resp 16   SpO2 99%   Physical Exam Constitutional:      Comments: Alert nontoxic.  No respiratory distress.  HENT:     Head: Normocephalic and atraumatic.  Cardiovascular:     Rate and Rhythm: Normal rate and regular rhythm.     Pulses: Normal pulses.  Pulmonary:  Effort: Pulmonary effort is normal.     Breath sounds: Normal breath sounds.  Musculoskeletal:     Comments: No appreciable difference in swelling of either knee or lower leg.  Patient does however have notably very obese lower extremities obscuring some normal anatomical markings.  Patient does endorse some discomfort along the medial joint line on the left knee and in the popliteal fossa.  There is no warmth or erythema to the knee.  No appreciable significant effusion.  Patient endorses some discomfort to the upper medial calf.  No pain or edema of the foot or ankle.  Dorsalis pedis pulse is 2+ and strong.  Skin condition is normal.  Skin:    General: Skin is warm and dry.  Neurological:     General: No focal deficit present.     Mental Status: She is oriented  to person, place, and time.     Coordination: Coordination normal.  Psychiatric:        Mood and Affect: Mood normal.      ED Treatments / Results  Labs (all labs ordered are listed, but only abnormal results are displayed) Labs Reviewed  BASIC METABOLIC PANEL - Abnormal; Notable for the following components:      Result Value   Potassium 3.2 (*)    Glucose, Bld 154 (*)    Creatinine, Ser 1.16 (*)    Calcium 8.7 (*)    GFR calc non Af Amer 53 (*)    All other components within normal limits  CBC - Abnormal; Notable for the following components:   WBC 10.8 (*)    Hemoglobin 11.3 (*)    HCT 35.5 (*)    All other components within normal limits  I-STAT CREATININE, ED - Abnormal; Notable for the following components:   Creatinine, Ser 1.30 (*)    All other components within normal limits  I-STAT TROPONIN, ED    EKG None  Radiology Vas [place] Lower Extremity Venous (dvt) (only [facility])  Result Date: 06/23/2018  Lower Venous Study Indications: Pain.  Performing Technologist: Gertie Fey MHA, RDMS, RVT, RDCS  Examination Guidelines: A complete evaluation includes B-mode imaging, spectral Doppler, color Doppler, and power Doppler as needed of all accessible portions of each vessel. Bilateral testing is considered an integral part of a complete examination. Limited examinations for reoccurring indications may be performed as noted.  Right Venous Findings: +---+---------------+---------+-----------+----------+-------+    CompressibilityPhasicitySpontaneityPropertiesSummary +---+---------------+---------+-----------+----------+-------+ CFVFull           Yes      Yes                          +---+---------------+---------+-----------+----------+-------+  Left Venous Findings: +---------+---------------+---------+-----------+----------+--------------+          CompressibilityPhasicitySpontaneityPropertiesSummary         +---------+---------------+---------+-----------+----------+--------------+ CFV      Full           Yes      Yes                                 +---------+---------------+---------+-----------+----------+--------------+ SFJ      Full                                                        +---------+---------------+---------+-----------+----------+--------------+  FV Prox  Full                                                        +---------+---------------+---------+-----------+----------+--------------+ FV Mid   Full                                                        +---------+---------------+---------+-----------+----------+--------------+ FV DistalFull                                                        +---------+---------------+---------+-----------+----------+--------------+ PFV      Full                                                        +---------+---------------+---------+-----------+----------+--------------+ POP      Full           Yes      Yes                                 +---------+---------------+---------+-----------+----------+--------------+ PTV      Full                                                        +---------+---------------+---------+-----------+----------+--------------+ PERO                                                  Not visualized +---------+---------------+---------+-----------+----------+--------------+    Summary: Right: No evidence of common femoral vein obstruction. Left: There is no evidence of deep vein thrombosis in the lower extremity. A cystic structure is found in the popliteal fossa.  *See table(s) above for measurements and observations.    Preliminary     Procedures Procedures (including critical care time)  Medications Ordered in ED Medications - No data to display   Initial Impression / Assessment and Plan / ED Course  I have reviewed the triage vital signs and the nursing  notes.  Pertinent labs & imaging results that were available during my care of the patient were reviewed by me and considered in my medical decision making (see chart for details).        DVT is ruled out by ultrasound.  Findings suggestive of internal knee derangement.  Ultrasound shows a cyst in the left popliteal space consistent with likely Baker's cyst fitting acute onset of symptoms and stiffness of the knee.  No signs of septic joint.  Patient is otherwise clinically well.  Will treat with rest elevation ice  and orthopedic follow-up.  Patient is given a prescription for naproxen and a small prescription of Vicodin for acute pain control.  Patient is counseled on necessity for close follow-up and monitoring with use of NSAIDs with personal history of hypertension and congestive heart failure.  Final Clinical Impressions(s) / ED Diagnoses   Final diagnoses:  Left leg pain  Synovial cyst of left popliteal space    ED Discharge Orders         Ordered    naproxen (NAPROSYN) 375 MG tablet  2 times daily     06/23/18 1632    HYDROcodone-acetaminophen (NORCO/VICODIN) 5-325 MG tablet  Every 6 hours PRN     06/23/18 1632           Arby Barrette, MD 06/23/18 1644

## 2018-06-23 NOTE — ED Notes (Signed)
Patient transported to Vascular. Transport notified to bring back to pt room when finished.

## 2018-06-23 NOTE — Progress Notes (Signed)
Left lower extremity venous duplex completed. Refer to "CV Proc" under chart review to view preliminary results.  06/23/2018 3:42 PM Gertie Fey, MHA, RVT, RDCS, RDMS

## 2018-07-21 ENCOUNTER — Encounter: Payer: Self-pay | Admitting: Internal Medicine

## 2018-10-13 ENCOUNTER — Encounter: Payer: Self-pay | Admitting: Internal Medicine

## 2018-11-16 ENCOUNTER — Ambulatory Visit (HOSPITAL_COMMUNITY)
Admission: EM | Admit: 2018-11-16 | Discharge: 2018-11-16 | Disposition: A | Discharge status: Home / Self Care | Payer: BLUE CROSS/BLUE SHIELD | Attending: Urgent Care | Admitting: Urgent Care

## 2018-11-16 ENCOUNTER — Ambulatory Visit (INDEPENDENT_AMBULATORY_CARE_PROVIDER_SITE_OTHER): Payer: BLUE CROSS/BLUE SHIELD

## 2018-11-16 ENCOUNTER — Encounter (HOSPITAL_COMMUNITY): Payer: Self-pay

## 2018-11-16 ENCOUNTER — Other Ambulatory Visit: Payer: Self-pay

## 2018-11-16 DIAGNOSIS — I10 Essential (primary) hypertension: Secondary | ICD-10-CM

## 2018-11-16 DIAGNOSIS — M79672 Pain in left foot: Secondary | ICD-10-CM

## 2018-11-16 DIAGNOSIS — B3731 Acute candidiasis of vulva and vagina: Secondary | ICD-10-CM

## 2018-11-16 DIAGNOSIS — R030 Elevated blood-pressure reading, without diagnosis of hypertension: Secondary | ICD-10-CM | POA: Diagnosis not present

## 2018-11-16 DIAGNOSIS — I509 Heart failure, unspecified: Secondary | ICD-10-CM

## 2018-11-16 DIAGNOSIS — B373 Candidiasis of vulva and vagina: Secondary | ICD-10-CM

## 2018-11-16 HISTORY — DX: Heart failure, unspecified: I50.9

## 2018-11-16 MED ORDER — FLUCONAZOLE 150 MG PO TABS: 150.0000 mg | ORAL_TABLET | ORAL | 0 refills | Status: DC

## 2018-11-16 MED ORDER — LISINOPRIL 20 MG PO TABS: ORAL_TABLET | ORAL | 0 refills | Status: DC

## 2018-11-16 NOTE — Discharge Instructions (Addendum)

## 2018-11-16 NOTE — ED Provider Notes (Addendum)
MRN: [id number] DOB: [birth year]  Subjective:   Maria Greene is a 58 y.o. female presenting for multiple complaints.  Patient reports that she believes she stepped on a piece of glass 2 to 3 weeks ago.  Symptoms resolved for the most part but has persistent foreign body sensation in now has progressively worsening pain over area of her foot that was injured.  She reports the area is hard and she feels like skin grew over a foreign body.  She also reports having vaginal irritation, itching and redness.  States that she took amoxicillin for her teeth recently and very commonly gets yeast infections when taking antibiotics.  She also reports that she is out of her blood pressure medications and would like a refill.  She admits medical noncompliance.  Has previously seen a cardiologist and had a PCP but does not follow-up.  She has previously been on chlorthalidone, lisinopril-hydrochlorothiazide but has been out of her medications for months.  No current facility-administered medications for this encounter.   Current Outpatient Medications:  .  atorvastatin (LIPITOR) 20 MG tablet, Take 1 tablet (20 mg total) by mouth daily., Disp: 30 tablet, Rfl: 0 .  chlorthalidone (HYGROTON) 25 MG tablet, Take 1 tablet (25 mg total) by mouth daily., Disp: 90 tablet, Rfl: 3 .  HYDROcodone-acetaminophen (NORCO/VICODIN) 5-325 MG tablet, Take 1 tablet by mouth every 6 (six) hours as needed for severe pain., Disp: 12 tablet, Rfl: 0 .  lisinopril (PRINIVIL,ZESTRIL) 10 MG tablet, Take 1 tablet (10 mg total) by mouth daily., Disp: 90 tablet, Rfl: 1 .  naproxen (NAPROSYN) 375 MG tablet, Take 1 tablet (375 mg total) by mouth 2 (two) times daily., Disp: 20 tablet, Rfl: 0   Allergies  Allergen Reactions  . Percocet [Oxycodone-Acetaminophen] Nausea And Vomiting    Past Medical History:  Diagnosis Date  . CHF (congestive heart failure) ([place])   . Dyspnea 03/29/2018  . Hypertension   . Hypertensive urgency 03/29/2018      Past Surgical History:  Procedure Laterality Date  . ABDOMINAL HYSTERECTOMY    . CHOLECYSTECTOMY    . KNEE ARTHROSCOPY    . TUBAL LIGATION       History reviewed. No pertinent family history.    Review of Systems  Constitutional: Negative for fever and malaise/fatigue.  HENT: Negative for congestion, ear pain, sinus pain and sore throat.   Eyes: Negative for blurred vision, double vision, discharge and redness.  Respiratory: Negative for cough, hemoptysis, shortness of breath and wheezing.   Cardiovascular: Negative for chest pain and leg swelling.  Gastrointestinal: Negative for abdominal pain, diarrhea, nausea and vomiting.  Genitourinary: Negative for dysuria, flank pain and hematuria.  Musculoskeletal: Negative for myalgias.  Skin: Negative for rash.  Neurological: Negative for dizziness, weakness and headaches.  Psychiatric/Behavioral: Negative for depression and substance abuse.     Objective:   Vitals: BP (!) 178/100 (BP Location: Left Arm)   Pulse 87   Temp 98.2 F (36.8 C) (Oral)   Resp 16   Wt 277 lb (125.6 kg)   SpO2 100%   BMI 52.34 kg/m   Physical Exam Constitutional:      General: She is not in acute distress.    Appearance: Normal appearance. She is well-developed. She is obese. She is not ill-appearing, toxic-appearing or diaphoretic.  HENT:     Head: Normocephalic and atraumatic.     Right Ear: External ear normal.     Left Ear: External ear normal.     Nose: Nose normal.  Mouth/Throat:     Mouth: Mucous membranes are moist.     Pharynx: Oropharynx is clear.  Eyes:     General: No scleral icterus.    Extraocular Movements: Extraocular movements intact.     Pupils: Pupils are equal, round, and reactive to light.  Neck:     Musculoskeletal: Normal range of motion and neck supple.     Vascular: No carotid bruit.  Cardiovascular:     Rate and Rhythm: Normal rate and regular rhythm.     Pulses: Normal pulses.     Heart sounds: Normal  heart sounds. No murmur. No friction rub. No gallop.   Pulmonary:     Effort: Pulmonary effort is normal. No respiratory distress.     Breath sounds: Normal breath sounds. No stridor. No wheezing, rhonchi or rales.  Abdominal:     General: Bowel sounds are normal. There is no distension.     Palpations: Abdomen is soft. There is no mass.     Tenderness: There is no abdominal tenderness. There is no right CVA tenderness, left CVA tenderness, guarding or rebound.  Musculoskeletal:        General: No swelling.     Right lower leg: No edema.     Left lower leg: No edema.     Left foot: Normal range of motion. Tenderness present. No laceration.       Feet:  Skin:    General: Skin is warm and dry.     Coloration: Skin is not pale.     Findings: No rash.  Neurological:     General: No focal deficit present.     Mental Status: She is alert and oriented to person, place, and time.     Cranial Nerves: No cranial nerve deficit.     Coordination: Coordination abnormal ([place]).  Psychiatric:        Mood and Affect: Mood normal.        Behavior: Behavior normal.        Thought Content: Thought content normal.        Judgment: Judgment normal.     Dg Foot Complete Left  Result Date: 11/16/2018 CLINICAL DATA:  58 year old female left foot pain and plantar surface foreign body sensation with weight-bearing. EXAM: LEFT FOOT - COMPLETE 3+ VIEW COMPARISON:  None. FINDINGS: Generalized soft tissue swelling. Degenerative spurring at the calcaneus and along the dorsal tarsal bones. 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 No acute osseous abnormality identified. No soft tissue gas. No radiopaque foreign body identified. IMPRESSION: Degenerative changes. No radiopaque foreign body or acute osseous abnormality identified. Electronically Signed   By: Odessa FlemingH  Hall M.D.   On: 11/16/2018 11:36     Assessment and Plan :   1. Yeast vaginitis   2. Essential hypertension   3. Elevated blood pressure reading    4. Morbid obesity (HCC)   5. Left foot pain   6. Chronic congestive heart failure, unspecified heart failure type (HCC)     We will use Diflucan for antibiotic associated yeast vaginitis.  Emphasized need for medical compliance especially in the setting of heart failure and uncontrolled hypertension.  I will refill lisinopril for patient including dosing titration instructions.  [facility] to help with establishing primary care.  Strict ER precautions.  No radiopaque foreign body seen on x-ray, counseled patient that she likely has a callus where she injured her foot and if she would like a consult podiatry can reach out to  them. Counseled patient on potential for adverse effects with medications prescribed/recommended today, return-to-clinic precautions discussed, patient verbalized understanding.     Wallis BambergMani, Rashi Giuliani, PA-C 11/16/18 1155

## 2018-11-16 NOTE — ED Triage Notes (Signed)
Pt states she thinks she has a yeast infection. Pt states she stepped on something 3 weeks ago. Pt has pain in her left foot. Pt thinks that something may still be in her foot.

## 2019-01-30 ENCOUNTER — Other Ambulatory Visit: Payer: Self-pay

## 2019-01-30 ENCOUNTER — Emergency Department (HOSPITAL_COMMUNITY): Payer: BLUE CROSS/BLUE SHIELD

## 2019-01-30 ENCOUNTER — Encounter (HOSPITAL_COMMUNITY): Payer: Self-pay | Admitting: Emergency Medicine

## 2019-01-30 ENCOUNTER — Inpatient Hospital Stay (HOSPITAL_COMMUNITY)
Admission: EM | Admit: 2019-01-30 | Discharge: 2019-02-01 | DRG: 305 | Disposition: A | Discharge status: Home / Self Care | Payer: BLUE CROSS/BLUE SHIELD | Attending: Internal Medicine | Admitting: Internal Medicine

## 2019-01-30 DIAGNOSIS — Z9049 Acquired absence of other specified parts of digestive tract: Secondary | ICD-10-CM

## 2019-01-30 DIAGNOSIS — Z823 Family history of stroke: Secondary | ICD-10-CM

## 2019-01-30 DIAGNOSIS — Z885 Allergy status to narcotic agent status: Secondary | ICD-10-CM

## 2019-01-30 DIAGNOSIS — Z791 Long term (current) use of non-steroidal anti-inflammatories (NSAID): Secondary | ICD-10-CM

## 2019-01-30 DIAGNOSIS — I110 Hypertensive heart disease with heart failure: Secondary | ICD-10-CM | POA: Diagnosis present

## 2019-01-30 DIAGNOSIS — M542 Cervicalgia: Secondary | ICD-10-CM | POA: Diagnosis present

## 2019-01-30 DIAGNOSIS — R079 Chest pain, unspecified: Secondary | ICD-10-CM | POA: Diagnosis not present

## 2019-01-30 DIAGNOSIS — I5042 Chronic combined systolic (congestive) and diastolic (congestive) heart failure: Secondary | ICD-10-CM | POA: Diagnosis present

## 2019-01-30 DIAGNOSIS — Z79899 Other long term (current) drug therapy: Secondary | ICD-10-CM

## 2019-01-30 DIAGNOSIS — Z833 Family history of diabetes mellitus: Secondary | ICD-10-CM

## 2019-01-30 DIAGNOSIS — Z90710 Acquired absence of both cervix and uterus: Secondary | ICD-10-CM

## 2019-01-30 DIAGNOSIS — R519 Headache, unspecified: Secondary | ICD-10-CM

## 2019-01-30 DIAGNOSIS — E785 Hyperlipidemia, unspecified: Secondary | ICD-10-CM | POA: Diagnosis present

## 2019-01-30 DIAGNOSIS — I160 Hypertensive urgency: Secondary | ICD-10-CM | POA: Diagnosis not present

## 2019-01-30 DIAGNOSIS — Z20828 Contact with and (suspected) exposure to other viral communicable diseases: Secondary | ICD-10-CM | POA: Diagnosis present

## 2019-01-30 DIAGNOSIS — Z6841 Body Mass Index (BMI) 40.0 and over, adult: Secondary | ICD-10-CM

## 2019-01-30 LAB — COMPREHENSIVE METABOLIC PANEL
ALT: 12 U/L (ref 0–44)
AST: 14 U/L — ABNORMAL LOW (ref 15–41)
Albumin: 3.2 g/dL — ABNORMAL LOW (ref 3.5–5.0)
Alkaline Phosphatase: 71 U/L (ref 38–126)
Anion gap: 9 (ref 5–15)
BUN: 10 mg/dL (ref 6–20)
CO2: 25 mmol/L (ref 22–32)
Calcium: 8.6 mg/dL — ABNORMAL LOW (ref 8.9–10.3)
Chloride: 105 mmol/L (ref 98–111)
Creatinine, Ser: 0.95 mg/dL (ref 0.44–1.00)
GFR calc Af Amer: >60 mL/min (ref >60)
GFR calc non Af Amer: >60 mL/min (ref >60)
Glucose, Bld: 123 mg/dL — ABNORMAL HIGH (ref 70–99)
Potassium: 3.7 mmol/L (ref 3.5–5.1)
Sodium: 139 mmol/L (ref 135–145)
Total Bilirubin: 0.5 mg/dL (ref 0.3–1.2)
Total Protein: 7 g/dL (ref 6.5–8.1)

## 2019-01-30 LAB — BRAIN NATRIURETIC PEPTIDE: B Natriuretic Peptide: 50.6 pg/mL (ref 0.0–100.0)

## 2019-01-30 LAB — CBC WITH DIFFERENTIAL/PLATELET
Abs Immature Granulocytes: 0.05 10*3/uL (ref 0.00–0.07)
Basophils Absolute: 0.1 10*3/uL (ref 0.0–0.1)
Basophils Relative: 1 %
Eosinophils Absolute: 0.2 10*3/uL (ref 0.0–0.5)
Eosinophils Relative: 2 %
HCT: 34.3 % — ABNORMAL LOW (ref 36.0–46.0)
Hemoglobin: 11 g/dL — ABNORMAL LOW (ref 12.0–15.0)
Immature Granulocytes: 1 %
Lymphocytes Relative: 15 %
Lymphs Abs: 1.5 10*3/uL (ref 0.7–4.0)
MCH: 26.5 pg (ref 26.0–34.0)
MCHC: 32.1 g/dL (ref 30.0–36.0)
MCV: 82.7 fL (ref 80.0–100.0)
Monocytes Absolute: 0.6 10*3/uL (ref 0.1–1.0)
Monocytes Relative: 6 %
Neutro Abs: 7.4 10*3/uL (ref 1.7–7.7)
Neutrophils Relative %: 75 %
Platelets: 340 10*3/uL (ref 150–400)
RBC: 4.15 MIL/uL (ref 3.87–5.11)
RDW: 14.4 % (ref 11.5–15.5)
WBC: 9.8 10*3/uL (ref 4.0–10.5)
nRBC: 0 % (ref 0.0–0.2)

## 2019-01-30 LAB — CBC
HCT: 33 % — ABNORMAL LOW (ref 36.0–46.0)
Hemoglobin: 10.6 g/dL — ABNORMAL LOW (ref 12.0–15.0)
MCH: 26.3 pg (ref 26.0–34.0)
MCHC: 32.1 g/dL (ref 30.0–36.0)
MCV: 81.9 fL (ref 80.0–100.0)
Platelets: 368 10*3/uL (ref 150–400)
RBC: 4.03 MIL/uL (ref 3.87–5.11)
RDW: 14.5 % (ref 11.5–15.5)
WBC: 10.1 10*3/uL (ref 4.0–10.5)
nRBC: 0 % (ref 0.0–0.2)

## 2019-01-30 LAB — URINALYSIS, ROUTINE W REFLEX MICROSCOPIC
Bilirubin Urine: NEGATIVE
Glucose, UA: NEGATIVE mg/dL
Hgb urine dipstick: NEGATIVE
Ketones, ur: NEGATIVE mg/dL
Leukocytes,Ua: NEGATIVE
Nitrite: NEGATIVE
Protein, ur: NEGATIVE mg/dL
Specific Gravity, Urine: 1.011 (ref 1.005–1.030)
pH: 7 (ref 5.0–8.0)

## 2019-01-30 LAB — PROTIME-INR
INR: 1 (ref 0.8–1.2)
Prothrombin Time: 13.2 seconds (ref 11.4–15.2)

## 2019-01-30 LAB — TROPONIN I (HIGH SENSITIVITY)
Troponin I (High Sensitivity): 7 ng/L (ref <18)
Troponin I (High Sensitivity): 6 ng/L (ref <18)

## 2019-01-30 LAB — SARS CORONAVIRUS 2 (TAT 6-24 HRS): SARS Coronavirus 2: NEGATIVE

## 2019-01-30 LAB — CREATININE, SERUM
Creatinine, Ser: 1.01 mg/dL — ABNORMAL HIGH (ref 0.44–1.00)
GFR calc Af Amer: >60 mL/min (ref >60)
GFR calc non Af Amer: >60 mL/min (ref >60)

## 2019-01-30 MED ORDER — CHLORTHALIDONE 25 MG PO TABS: 25.0000 mg | ORAL_TABLET | Freq: Every day | ORAL | Status: DC

## 2019-01-30 MED ORDER — ACETAMINOPHEN 500 MG PO TABS
1000.0000 mg | ORAL_TABLET | Freq: Once | ORAL | Status: AC
  Administered 2019-01-30: 1000 mg via ORAL
  Filled 2019-01-30: qty 2

## 2019-01-30 MED ORDER — LABETALOL HCL 5 MG/ML IV SOLN
10.0000 mg | Freq: Once | INTRAVENOUS | Status: AC | PRN
  Administered 2019-01-31: 10 mg via INTRAVENOUS
  Filled 2019-01-30: qty 4

## 2019-01-30 MED ORDER — NITROGLYCERIN 2 % TD OINT
1.0000 [in_us] | TOPICAL_OINTMENT | Freq: Four times a day (QID) | TRANSDERMAL | Status: DC
  Administered 2019-01-30 (×2): 1 [in_us] via TOPICAL
  Filled 2019-01-30 (×2): qty 1

## 2019-01-30 MED ORDER — METOPROLOL TARTRATE 5 MG/5ML IV SOLN
5.0000 mg | Freq: Once | INTRAVENOUS | Status: AC
  Administered 2019-01-30: 5 mg via INTRAVENOUS
  Filled 2019-01-30: qty 5

## 2019-01-30 MED ORDER — ACETAMINOPHEN 325 MG PO TABS: 325.0000 mg | ORAL_TABLET | Freq: Once | ORAL | Status: DC

## 2019-01-30 MED ORDER — LISINOPRIL 20 MG PO TABS: 20.0000 mg | ORAL_TABLET | Freq: Every day | ORAL | Status: DC

## 2019-01-30 MED ORDER — ATORVASTATIN CALCIUM 10 MG PO TABS: 20.0000 mg | ORAL_TABLET | Freq: Every day | ORAL | Status: DC

## 2019-01-30 MED ORDER — SENNOSIDES-DOCUSATE SODIUM 8.6-50 MG PO TABS: 1.0000 | ORAL_TABLET | Freq: Every evening | ORAL | Status: DC | PRN

## 2019-01-30 MED ORDER — LABETALOL HCL 5 MG/ML IV SOLN
20.0000 mg | Freq: Once | INTRAVENOUS | Status: AC
  Administered 2019-01-30: 20 mg via INTRAVENOUS
  Filled 2019-01-30: qty 4

## 2019-01-30 MED ORDER — LISINOPRIL 20 MG PO TABS
20.0000 mg | ORAL_TABLET | Freq: Once | ORAL | Status: AC
  Administered 2019-01-30: 20 mg via ORAL
  Filled 2019-01-30: qty 1

## 2019-01-30 MED ORDER — ENOXAPARIN SODIUM 40 MG/0.4ML ~~LOC~~ SOLN
40.0000 mg | SUBCUTANEOUS | Status: DC
  Administered 2019-01-31 – 2019-02-01 (×2): 40 mg via SUBCUTANEOUS
  Filled 2019-01-30 (×2): qty 0.4

## 2019-01-30 MED ORDER — CHLORTHALIDONE 25 MG PO TABS
25.0000 mg | ORAL_TABLET | Freq: Every day | ORAL | Status: DC
  Administered 2019-01-31 – 2019-02-01 (×2): 25 mg via ORAL
  Filled 2019-01-30 (×2): qty 1

## 2019-01-30 MED ORDER — SODIUM CHLORIDE 0.9% FLUSH
3.0000 mL | Freq: Two times a day (BID) | INTRAVENOUS | Status: DC
  Administered 2019-01-30 – 2019-02-01 (×3): 3 mL via INTRAVENOUS

## 2019-01-30 MED ORDER — LISINOPRIL 20 MG PO TABS
20.0000 mg | ORAL_TABLET | Freq: Every day | ORAL | Status: DC
  Administered 2019-01-31: 20 mg via ORAL
  Filled 2019-01-30: qty 1

## 2019-01-30 MED ORDER — IBUPROFEN 400 MG PO TABS
400.0000 mg | ORAL_TABLET | Freq: Four times a day (QID) | ORAL | Status: DC | PRN
  Administered 2019-01-30 – 2019-01-31 (×3): 400 mg via ORAL
  Filled 2019-01-30 (×3): qty 1

## 2019-01-30 NOTE — ED Triage Notes (Signed)
Pt in with central cp and HTN. States the pain has been building since her sister died last week, pt has been very anxious. Denies any n/v, sob or cough. BP 203/103, has been taking meds. Also has HA

## 2019-01-30 NOTE — ED Provider Notes (Signed)
[facility] Provider Note   CSN: [id number] Arrival date & time: 01/30/19  [id number]     History   Chief Complaint Chief Complaint  Patient presents with  . Chest Pain  . Hypertension    HPI Lucilla LameRobin Gettinger is a 58 y.o. female.     HPI Patient reports that she has been getting chest pain intermittently over the past week.  She gets a central pressure with heaviness and burning in the chest.  She reports she has been feeling lightheaded intemittently as well. She has been taking her blood pressure medications as prescribed and took an extra dose of lisinopril late last night without improvement. She reports she is also getting a throbbing, generalized headache that is persistent since yesterday. She reports this headache is typical when her blood pressure is elevated. No focal weakness of extremities, however she feels very generally weak and feels like it is making her gait unsteady. Patient has had significant recent stressors with a sudden rash of multiple deaths in the family just recently.  Patient reports she has previously been diagnosed with congestive heart failure.  She has been recently been getting her medications from urgent care and has not been seen locally by a cardiologist or consistently by primary care provider.  She reports that she just recently got improved insurance coverage and is working on getting established with a primary provider. Past Medical History:  Diagnosis Date  . CHF (congestive heart failure) (HCC)   . Dyspnea 03/29/2018  . Hypertension   . Hypertensive urgency 03/29/2018    Patient Active Problem List   Diagnosis Date Noted  . Essential hypertension 04/06/2018  . Chronic heart failure with preserved ejection fraction (HCC) 04/06/2018    Past Surgical History:  Procedure Laterality Date  . ABDOMINAL HYSTERECTOMY    . CHOLECYSTECTOMY    . KNEE ARTHROSCOPY    . TUBAL LIGATION       OB History   No obstetric  history on file.      Home Medications    Prior to Admission medications   Medication Sig Start Date End Date Taking? Authorizing Provider  acetaminophen (TYLENOL) 500 MG tablet Take 1,000 mg by mouth every 6 (six) hours as needed for mild pain or headache.   Yes [provider]  ibuprofen (ADVIL) 200 MG tablet Take 800 mg by mouth every 6 (six) hours as needed (leg pain).   Yes [provider]  lisinopril (ZESTRIL) 20 MG tablet Take 1/2 tablet daily for 1 week. Then increase to 1 tablet daily. Patient taking differently: Take 20 mg by mouth daily.  11/16/18  Yes Wallis BambergMani, Mario, PA-C  Multiple Vitamins-Minerals (MULTI FOR HER 50+) TABS Take 1 tablet by mouth daily.   Yes [provider]  atorvastatin (LIPITOR) 20 MG tablet Take 1 tablet (20 mg total) by mouth daily. Patient not taking: Reported on 01/30/2019 03/30/18   Ali LoweVogel, Marie S, MD  chlorthalidone (HYGROTON) 25 MG tablet Take 1 tablet (25 mg total) by mouth daily. Patient not taking: Reported on 01/30/2019 04/06/18   Levora DredgeHelberg, Justin, MD  HYDROcodone-acetaminophen (NORCO/VICODIN) 5-325 MG tablet Take 1 tablet by mouth every 6 (six) hours as needed for severe pain. Patient not taking: Reported on 01/30/2019 06/23/18   Arby BarrettePfeiffer, Keaston Pile, MD  naproxen (NAPROSYN) 375 MG tablet Take 1 tablet (375 mg total) by mouth 2 (two) times daily. Patient not taking: Reported on 01/30/2019 06/23/18   Arby BarrettePfeiffer, Gabrial Domine, MD    Family History No  family history on file.  Social History Social History   Tobacco Use  . Smoking status: Never Smoker  . Smokeless tobacco: Never Used  Substance Use Topics  . Alcohol use: No  . Drug use: No     Allergies   Percocet [oxycodone-acetaminophen]   Review of Systems Review of Systems 10 Systems reviewed and are negative for acute change except as noted in the HPI.   Physical Exam Updated Vital Signs BP (!) 203/103 (BP Location: Left Arm)   Pulse 89   Temp 98.7 F (37.1 C)  (Oral)   Resp 20   Wt 125.6 kg   SpO2 96%   BMI 52.32 kg/m   Physical Exam Constitutional:      Comments: Alert and nontoxic.  Moderate obesity.  No respiratory distress at rest.  HENT:     Head: Normocephalic and atraumatic.     Nose: Nose normal.  Eyes:     Extraocular Movements: Extraocular movements intact.     Pupils: Pupils are equal, round, and reactive to light.  Cardiovascular:     Rate and Rhythm: Normal rate and regular rhythm.  Pulmonary:     Effort: Pulmonary effort is normal.     Breath sounds: Normal breath sounds.  Abdominal:     General: There is no distension.     Tenderness: There is no abdominal tenderness. There is no guarding.  Musculoskeletal: Normal range of motion.        General: No swelling or tenderness.     Right lower leg: No edema.     Left lower leg: No edema.  Skin:    General: Skin is warm and dry.  Neurological:     General: No focal deficit present.     Mental Status: She is oriented to person, place, and time.     Cranial Nerves: No cranial nerve deficit.     Motor: No weakness.     Coordination: Coordination normal.  Psychiatric:        Mood and Affect: Mood normal.        Behavior: Behavior normal.      ED Treatments / Results  Labs (all labs ordered are listed, but only abnormal results are displayed) Labs Reviewed - No data to display  EKG EKG Interpretation  Date/Time:  Sunday January 30 2019 09:59:22 EDT Ventricular Rate:  94 PR Interval:    QRS Duration: 88 QT Interval:  345 QTC Calculation: 432 R Axis:   65 Text Interpretation:  Sinus rhythm Probable LVH with secondary repol abnrm similar to previous, question subtle lateral increased st depression Confirmed by Arby Barrette [phone]) on 01/30/2019 11:41:12 AM   Radiology No results found.  Procedures Procedures (including critical care time) CRITICAL CARE Performed by: Arby Barrette   Total critical care time: 30 minutes  Critical care time was  exclusive of separately billable procedures and treating other patients.  Critical care was necessary to treat or prevent imminent or life-threatening deterioration.  Critical care was time spent personally by me on the following activities: development of treatment plan with patient and/or surrogate as well as nursing, discussions with consultants, evaluation of patient's response to treatment, examination of patient, obtaining history from patient or surrogate, ordering and performing treatments and interventions, ordering and review of laboratory studies, ordering and review of radiographic studies, pulse oximetry and re-evaluation of patient's condition. Medications Ordered in ED Medications - No data to display   Initial Impression / Assessment and Plan / ED Course  I  have reviewed the triage vital signs and the nursing notes.  Pertinent labs & imaging results that were available during my care of the patient were reviewed by me and considered in my medical decision making (see chart for details).        Consult: Internal medicine teaching service for admission.  Patient presents with chest pain sporadically over the past week and headache starting yesterday.  Blood pressure is significantly elevated.  Mental status is clear.  No focal neurologic deficits.  Concern for hypertensive urgency.  CT does not show acute finding.  Blood pressure has been treated with lisinopril 20 mg, metoprolol 5 mg IV, nitroglycerin paste and labetalol.  Patient has had about 20% decrease in blood pressure.  She does report some improvement in headache but posterior headache persists.  She reports she has been compliant with medications but blood pressures have not been responding.  At this time will admit for hypertensive urgency with headache and waxing and waning chest pain. Final Clinical Impressions(s) / ED Diagnoses   Final diagnoses:  Hypertensive urgency  Bad headache  Chest pain, unspecified type     ED Discharge Orders    None       Charlesetta Shanks, MD 01/30/19 1600

## 2019-01-30 NOTE — H&P (Signed)
Date: 01/30/2019               Patient Name:  Maria Greene MRN: [id number]  DOB: [birth year] Age / Sex: 58 y.o., female   PCP: Sid Falcon, MD         Medical Service: Internal Medicine Teaching Service         Attending Physician: Dr. Bartholomew Crews, MD    First Contact: Dr. Gilford Rile  Pager: [phone]  Second Contact: Dr. Myrtie Hawk Pager: [phone]       After Hours (After 5p/  First Contact Pager: [phone]  weekends / holidays): Second Contact Pager: [phone]   Chief Complaint: Headache and CP  History of Present Illness:   [name] is a 58 y/o female with a PMHx of HTN and CHF who presents to the ED with c/o headache and chest pain.   Ms. Carradine states she has been having central chest pain that is intermittent for approximately 1 week. She attributed this to the recent death of her sister, and two other close family deaths in the past week. However, in the past 24 hours, she developed severe headache that is located in the back of her head and radiates to her neck and eyes. The radiation to her eyes feels like a heavy pressure. The neck pain feels like a tense stiffness but denies further radiation of pain. No alleviating or exacerbating factors. Notes that this usually occurs when her BP is very high. Denies any fever, chills, runny nose, sore throat, cough, SOB, N/V/D, abdominal pain, orthopnea, dizziness, vision changes, urinary changes or bowel movement changes. She has been able to eat and drink as normal. She endorses chronic leg swelling.   She has been taking her Lisinopril as prescribed. In the past few months, she visited a urgent care who switched her off Chlorthalidone but she is unsure why.   Meds:  Current Meds  Medication Sig  . acetaminophen (TYLENOL) 500 MG tablet Take 1,000 mg by mouth every 6 (six) hours as needed for mild pain or headache.  . ibuprofen (ADVIL) 200 MG tablet Take 800 mg by mouth every 6 (six) hours as needed (leg pain).  Marland Kitchen  lisinopril (ZESTRIL) 20 MG tablet Take 1/2 tablet daily for 1 week. Then increase to 1 tablet daily. (Patient taking differently: Take 20 mg by mouth daily. )  . Multiple Vitamins-Minerals (MULTI FOR HER 50+) TABS Take 1 tablet by mouth daily.   Allergies: Allergies as of 01/30/2019 - Review Complete 01/30/2019  Allergen Reaction Noted  . Percocet [oxycodone-acetaminophen] Nausea And Vomiting 03/07/2014   Past Medical History:  Diagnosis Date  . CHF (congestive heart failure) ([place])   . Dyspnea 03/29/2018  . Hypertension   . Hypertensive urgency 03/29/2018   Family History:  Mother: CVA @ age 33-60, T2DM Father: T2Dm Sister: CVA @ age 51  Social History:  Denies tobacco use, alcohol use and drug use.   Review of Systems: A complete ROS was negative except as per HPI.   Physical Exam: Blood pressure (!) 152/94, pulse 83, temperature 98.7 F (37.1 C), temperature source Oral, resp. rate 17, height '5\' 1"'  (1.549 m), weight 125.6 kg, SpO2 96 %.  Physical Exam Vitals signs and nursing note reviewed.  Constitutional:      General: She is not in acute distress.    Appearance: She is obese. She is not toxic-appearing.  HENT:     Head: Normocephalic and atraumatic.  Neck:  Vascular: Hepatojugular reflux and JVD present.  Cardiovascular:     Rate and Rhythm: Normal rate and regular rhythm.     Heart sounds: Heart sounds not distant. Murmur present. Systolic murmur present with a grade of 2/6. No S3 or S4 sounds.   Pulmonary:     Effort: Pulmonary effort is normal. No tachypnea, accessory muscle usage or respiratory distress.     Breath sounds: Examination of the right-lower field reveals rales. Examination of the left-lower field reveals rales. Rales present. No decreased breath sounds, wheezing or rhonchi.  Abdominal:     Palpations: Abdomen is soft.     Tenderness: There is no abdominal tenderness. There is no guarding.  Musculoskeletal:     Right lower leg: Edema (trace  pitting) present.     Left lower leg: Edema (trace pitting) present.  Skin:    General: Skin is warm and dry.     Capillary Refill: Capillary refill takes less than 2 seconds.     Findings: No rash.  Neurological:     General: No focal deficit present.     Mental Status: She is alert and oriented to person, place, and time.  Psychiatric:        Mood and Affect: Mood normal.        Behavior: Behavior normal.    EKG: personally reviewed my interpretation is: Rate is 100 bpm. Sinus rhythm. ST elevation in V1-3 with ST depression in V5-6, both of which are mild.   CXR: personally reviewed my interpretation is: No pleural effusions. Clear heart contour. Mild cardiomegaly.   Assessment & Plan by Problem: Active Problems:   Hypertensive urgency  # Hypertensive urgency  BP on arrival was initially noted to be 193/109 which had come down to 163/93 during our examination after receiving Labetalol, Lisinopril, Metoprolol and Nitroglycerin paste. Troponins were unremarkable intially and on repeat. EKG changes are concerning, however history of CP that is intermittent without exacerbating or alleviating factors, is unlikely to be of coronary origin.  Ms. Moncur notes that she has been taking her home medications as prescribed which only includes Lisinopril 57m since recent Urgent care visit. Unsure why her Chlorthalidone was discontinued.   Will slowly decrease her BP over admission. So far has met 20% decline since arriving to the ED. Plan to restart home meds   - Telemetry  - Chlorthalidone 226m - Lisinopril 2095m- Labetolol 45m17mN for BP >180/110   # HFrEF  Last Echo in 03/2018 that showed a reduced EF of 45-50% with mild diffuse hypokinesis and grade 2 diastolic dysfunction. No follow up since then regarding this. Looks like she was discharged on Lasix but did not pick it up. She is mildly volume up on examination, as she does have JVP and bilateral crackles. However asymptomatic, so  will hold off on Lasix for now. TTE tomorrow for further evaluation.   - Monitor for increase in volume status - TTE tomorrow   # Hyperlipidemia Last lipid panel in 03/2018 that showed elevated LDL at 109. She is not currently taking any statins. Will start her on Atorvastatin 20mg13m now and repeat lipid panel to adjust medication as needed.   - Lipid panel pending - Atorvastatin 20mg 55mispo: Admit patient to Observation with expected length of stay less than 2 midnights.  Signed: Dr. Yael Angerer [facility] PGY-1  Pager: [phone]/2020, 9:34 PM

## 2019-01-30 NOTE — ED Notes (Signed)
Pt ambulated to restroom with steady gait.

## 2019-01-31 ENCOUNTER — Observation Stay (HOSPITAL_BASED_OUTPATIENT_CLINIC_OR_DEPARTMENT_OTHER): Payer: BLUE CROSS/BLUE SHIELD

## 2019-01-31 DIAGNOSIS — I5020 Unspecified systolic (congestive) heart failure: Secondary | ICD-10-CM

## 2019-01-31 DIAGNOSIS — M542 Cervicalgia: Secondary | ICD-10-CM | POA: Diagnosis present

## 2019-01-31 DIAGNOSIS — I5042 Chronic combined systolic (congestive) and diastolic (congestive) heart failure: Secondary | ICD-10-CM | POA: Diagnosis present

## 2019-01-31 DIAGNOSIS — Z79899 Other long term (current) drug therapy: Secondary | ICD-10-CM | POA: Diagnosis not present

## 2019-01-31 DIAGNOSIS — Z791 Long term (current) use of non-steroidal anti-inflammatories (NSAID): Secondary | ICD-10-CM | POA: Diagnosis not present

## 2019-01-31 DIAGNOSIS — R0989 Other specified symptoms and signs involving the circulatory and respiratory systems: Secondary | ICD-10-CM | POA: Diagnosis not present

## 2019-01-31 DIAGNOSIS — Z885 Allergy status to narcotic agent status: Secondary | ICD-10-CM | POA: Diagnosis not present

## 2019-01-31 DIAGNOSIS — E876 Hypokalemia: Secondary | ICD-10-CM | POA: Diagnosis not present

## 2019-01-31 DIAGNOSIS — E785 Hyperlipidemia, unspecified: Secondary | ICD-10-CM

## 2019-01-31 DIAGNOSIS — Z90710 Acquired absence of both cervix and uterus: Secondary | ICD-10-CM | POA: Diagnosis not present

## 2019-01-31 DIAGNOSIS — Z9049 Acquired absence of other specified parts of digestive tract: Secondary | ICD-10-CM | POA: Diagnosis not present

## 2019-01-31 DIAGNOSIS — I110 Hypertensive heart disease with heart failure: Secondary | ICD-10-CM | POA: Diagnosis present

## 2019-01-31 DIAGNOSIS — I5032 Chronic diastolic (congestive) heart failure: Secondary | ICD-10-CM | POA: Diagnosis not present

## 2019-01-31 DIAGNOSIS — I160 Hypertensive urgency: Secondary | ICD-10-CM | POA: Diagnosis present

## 2019-01-31 DIAGNOSIS — D509 Iron deficiency anemia, unspecified: Secondary | ICD-10-CM

## 2019-01-31 DIAGNOSIS — Z20828 Contact with and (suspected) exposure to other viral communicable diseases: Secondary | ICD-10-CM | POA: Diagnosis present

## 2019-01-31 DIAGNOSIS — Z833 Family history of diabetes mellitus: Secondary | ICD-10-CM | POA: Diagnosis not present

## 2019-01-31 DIAGNOSIS — Z6841 Body Mass Index (BMI) 40.0 and over, adult: Secondary | ICD-10-CM | POA: Diagnosis not present

## 2019-01-31 DIAGNOSIS — R011 Cardiac murmur, unspecified: Secondary | ICD-10-CM

## 2019-01-31 DIAGNOSIS — R079 Chest pain, unspecified: Secondary | ICD-10-CM | POA: Diagnosis present

## 2019-01-31 DIAGNOSIS — Z823 Family history of stroke: Secondary | ICD-10-CM | POA: Diagnosis not present

## 2019-01-31 LAB — BASIC METABOLIC PANEL
Anion gap: 9 (ref 5–15)
BUN: 9 mg/dL (ref 6–20)
CO2: 25 mmol/L (ref 22–32)
Calcium: 8.3 mg/dL — ABNORMAL LOW (ref 8.9–10.3)
Chloride: 106 mmol/L (ref 98–111)
Creatinine, Ser: 0.89 mg/dL (ref 0.44–1.00)
GFR calc Af Amer: >60 mL/min (ref >60)
GFR calc non Af Amer: >60 mL/min (ref >60)
Glucose, Bld: 161 mg/dL — ABNORMAL HIGH (ref 70–99)
Potassium: 3.5 mmol/L (ref 3.5–5.1)
Sodium: 140 mmol/L (ref 135–145)

## 2019-01-31 LAB — CBC
HCT: 32.8 % — ABNORMAL LOW (ref 36.0–46.0)
Hemoglobin: 10.7 g/dL — ABNORMAL LOW (ref 12.0–15.0)
MCH: 26.8 pg (ref 26.0–34.0)
MCHC: 32.6 g/dL (ref 30.0–36.0)
MCV: 82 fL (ref 80.0–100.0)
Platelets: 319 10*3/uL (ref 150–400)
RBC: 4 MIL/uL (ref 3.87–5.11)
RDW: 14.5 % (ref 11.5–15.5)
WBC: 10.1 10*3/uL (ref 4.0–10.5)
nRBC: 0 % (ref 0.0–0.2)

## 2019-01-31 LAB — LIPID PANEL
Cholesterol: 147 mg/dL (ref 0–200)
HDL: 30 mg/dL — ABNORMAL LOW (ref >40)
LDL Cholesterol: 99 mg/dL (ref 0–99)
Total CHOL/HDL Ratio: 4.9 RATIO
Triglycerides: 90 mg/dL (ref <150)
VLDL: 18 mg/dL (ref 0–40)

## 2019-01-31 LAB — GLUCOSE, CAPILLARY: Glucose-Capillary: 148 mg/dL — ABNORMAL HIGH (ref 70–99)

## 2019-01-31 LAB — HEMOGLOBIN A1C
Hgb A1c MFr Bld: 5.7 % — ABNORMAL HIGH (ref 4.8–5.6)
Mean Plasma Glucose: 116.89 mg/dL

## 2019-01-31 MED ORDER — LISINOPRIL 20 MG PO TABS
20.0000 mg | ORAL_TABLET | Freq: Once | ORAL | Status: AC
  Administered 2019-01-31: 20 mg via ORAL
  Filled 2019-01-31: qty 1

## 2019-01-31 MED ORDER — LABETALOL HCL 5 MG/ML IV SOLN: 10.0000 mg | Freq: Once | INTRAVENOUS | Status: DC | PRN

## 2019-01-31 MED ORDER — LABETALOL HCL 5 MG/ML IV SOLN
10.0000 mg | INTRAVENOUS | Status: DC | PRN
  Administered 2019-01-31: 10 mg via INTRAVENOUS
  Filled 2019-01-31: qty 4

## 2019-01-31 MED ORDER — LISINOPRIL 40 MG PO TABS
40.0000 mg | ORAL_TABLET | Freq: Every day | ORAL | Status: DC
  Administered 2019-02-01: 40 mg via ORAL
  Filled 2019-01-31: qty 1

## 2019-01-31 MED ORDER — ATORVASTATIN CALCIUM 40 MG PO TABS
40.0000 mg | ORAL_TABLET | Freq: Every day | ORAL | Status: DC
  Administered 2019-01-31 – 2019-02-01 (×2): 40 mg via ORAL
  Filled 2019-01-31 (×2): qty 1

## 2019-01-31 NOTE — Progress Notes (Signed)
  Date: 01/31/2019  Patient name: Genie Wenke  Medical record number: [id number]  Date of birth: 02-Aug-1960        I have seen and evaluated this patient and I have discussed the plan of care with the house staff. Please see their note for complete details. I concur with their findings with the following additions/corrections: Ms. Mirkin was seen this morning in rounds.  She is feeling well and her headache has resolved.  She is asking when she can go home.  Blood pressure has improved but is not yet at goal.  She is willing to follow-up in [facility] for further blood pressure management.  She will be discharged on lisinopril 40 mg and HCTZ 25 as a combo pill.  Due to her 10-year cardiovascular risk, we will increase her lipid to 40 mg of atorvastatin.  Her anemia which is microcytic will need to be worked up as an outpatient.  Stable to be discharged home.  Bartholomew Crews, MD 01/31/2019, 2:55 PM

## 2019-01-31 NOTE — Progress Notes (Addendum)
   Subjective:  [name] was seen at bedside this morning. She states that her headache has resolved this morning, and that she is feeling much better than yesterday. We spoke about her home medications and following up with [place] for optimization of her medications. We spoke about her HTN and starting her home medications. We also agreed to increase and start her on 40 mg of Lipitor. She was agreeable to the changes. All questions and concerns were addressed.   Objective:  Vital signs in last 24 hours: Vitals:   01/30/19 1930 01/30/19 2049 01/30/19 2228 01/31/19 0610  BP: (!) 180/110 (!) 152/94 (!) 153/100 (!) 158/85  Pulse: 88 83 80 86  Resp: 15 17 18 18   Temp:   98.1 F (36.7 C) 98.3 F (36.8 C)  TempSrc:   Oral Oral  SpO2: 96% 96% 97% 96%  Weight:   (!) 138 kg   Height:   5\' 2"  (1.575 m)    Physical Exam Constitutional:      General: She is not in acute distress.    Appearance: She is well-developed. She is not ill-appearing or toxic-appearing.     Comments: Middle aged female, sitting comfortably in bed.   HENT:     Head: Normocephalic and atraumatic.  Cardiovascular:     Rate and Rhythm: Normal rate and regular rhythm.     Heart sounds: Heart sounds not distant. No murmur. No systolic murmur. No friction rub. No gallop.   Pulmonary:     Effort: Pulmonary effort is normal.     Breath sounds: No decreased breath sounds, wheezing or rhonchi.  Chest:     Chest wall: No tenderness.  Abdominal:     General: Bowel sounds are normal.     Palpations: Abdomen is soft.     Tenderness: There is no abdominal tenderness.  Neurological:     Mental Status: She is alert.     Assessment/Plan:  Active Problems:   Hypertensive urgency  Hypertensive Urgency: - Continue Telemetry  - Continue Chlorthalidone 25 mg - Increased Lisinopril to 40 mg - Labetolol 10mg  PRN for BP >180/110  - Will Follow up with [facility] clinic for medication optimization.   # HFrEF  - Monitor for increase  in volume status - Plan for a TTE in the outpatient setting  # Hyperlipidemia - Lipid: HDL 30 with LDL cholesterol of 99 - Atorvastatin 20mg   Dispo: Anticipated discharge in approximately 1 day.   Maudie Mercury, MD 01/31/2019, 6:26 AM [phone]

## 2019-01-31 NOTE — Progress Notes (Signed)
  Echocardiogram 2D Echocardiogram has been performed.  Bobbye Charleston 01/31/2019, 11:30 AM

## 2019-01-31 NOTE — Progress Notes (Signed)
RN informed MD of patients blood pressure no new orders at this time.

## 2019-02-01 DIAGNOSIS — E876 Hypokalemia: Secondary | ICD-10-CM

## 2019-02-01 LAB — BASIC METABOLIC PANEL
Anion gap: 10 (ref 5–15)
BUN: 10 mg/dL (ref 6–20)
CO2: 25 mmol/L (ref 22–32)
Calcium: 8.3 mg/dL — ABNORMAL LOW (ref 8.9–10.3)
Chloride: 102 mmol/L (ref 98–111)
Creatinine, Ser: 1 mg/dL (ref 0.44–1.00)
GFR calc Af Amer: >60 mL/min (ref >60)
GFR calc non Af Amer: >60 mL/min (ref >60)
Glucose, Bld: 149 mg/dL — ABNORMAL HIGH (ref 70–99)
Potassium: 3.4 mmol/L — ABNORMAL LOW (ref 3.5–5.1)
Sodium: 137 mmol/L (ref 135–145)

## 2019-02-01 LAB — GLUCOSE, CAPILLARY: Glucose-Capillary: 104 mg/dL — ABNORMAL HIGH (ref 70–99)

## 2019-02-01 MED ORDER — IBUPROFEN 400 MG PO TABS: 400.0000 mg | ORAL_TABLET | Freq: Four times a day (QID) | ORAL | 0 refills | Status: DC | PRN

## 2019-02-01 MED ORDER — ASPIRIN-ACETAMINOPHEN-CAFFEINE 250-250-65 MG PO TABS: 1.0000 | ORAL_TABLET | Freq: Once | ORAL | Status: DC

## 2019-02-01 MED ORDER — POTASSIUM CHLORIDE CRYS ER 20 MEQ PO TBCR
40.0000 meq | EXTENDED_RELEASE_TABLET | Freq: Once | ORAL | Status: AC
  Administered 2019-02-01: 40 meq via ORAL
  Filled 2019-02-01: qty 2

## 2019-02-01 MED ORDER — AMLODIPINE BESYLATE 5 MG PO TABS: 5.0000 mg | ORAL_TABLET | Freq: Every day | ORAL | 0 refills | Status: DC

## 2019-02-01 MED ORDER — AMLODIPINE BESYLATE 5 MG PO TABS
5.0000 mg | ORAL_TABLET | Freq: Every day | ORAL | Status: DC
  Administered 2019-02-01: 5 mg via ORAL
  Filled 2019-02-01: qty 1

## 2019-02-01 MED ORDER — ASPIRIN-ACETAMINOPHEN-CAFFEINE 250-250-65 MG PO TABS
2.0000 | ORAL_TABLET | Freq: Once | ORAL | Status: AC
  Administered 2019-02-01: 2 via ORAL
  Filled 2019-02-01: qty 2

## 2019-02-01 MED ORDER — LISINOPRIL 40 MG PO TABS: 40.0000 mg | ORAL_TABLET | Freq: Every day | ORAL | 0 refills | Status: DC

## 2019-02-01 MED ORDER — ATORVASTATIN CALCIUM 40 MG PO TABS: 40.0000 mg | ORAL_TABLET | Freq: Every day | ORAL | 0 refills | Status: DC

## 2019-02-01 NOTE — Progress Notes (Signed)
MD on call paged BP 178/92 hr 90 ibuprofen 400mg  not effective for headache 8/10. Arthor Captain LPN

## 2019-02-01 NOTE — Discharge Instructions (Signed)
Thank you for choosing [facility] for your health care maintenance. During your time with [place], you were diagnosed with hypertensive urgency. We started you back on your home medications and continued to watch your blood pressure until you were asymptomatic. You are being prescribed with your home medications plus another medication called amlodipine. Please take your medication as directed on the label to ensure control of your hypertension. Please also follow up in the clinic at your earliest convenience. Come back to the ED if you notice sudden blurry vision, chest pain, new onset headaches, or changes in your normal health.     Hypertension, Adult Hypertension is another name for high blood pressure. High blood pressure forces your heart to work harder to pump blood. This can cause problems over time. There are two numbers in a blood pressure reading. There is a top number (systolic) over a bottom number (diastolic). It is best to have a blood pressure that is below 120/80. Healthy choices can help lower your blood pressure, or you may need medicine to help lower it. What are the causes? The cause of this condition is not known. Some conditions may be related to high blood pressure. What increases the risk?  Smoking.  Having type 2 diabetes mellitus, high cholesterol, or both.  Not getting enough exercise or physical activity.  Being overweight.  Having too much fat, sugar, calories, or salt (sodium) in your diet.  Drinking too much alcohol.  Having long-term (chronic) kidney disease.  Having a family history of high blood pressure.  Age. Risk increases with age.  Race. You may be at higher risk if you are African American.  Gender. Men are at higher risk than women before age 60. After age 29, women are at higher risk than men.  Having obstructive sleep apnea.  Stress. What are the signs or symptoms?  High blood pressure may not cause symptoms. Very high blood pressure  (hypertensive crisis) may cause: ? Headache. ? Feelings of worry or nervousness (anxiety). ? Shortness of breath. ? Nosebleed. ? A feeling of being sick to your stomach (nausea). ? Throwing up (vomiting). ? Changes in how you see. ? Very bad chest pain. ? Seizures. How is this treated?  This condition is treated by making healthy lifestyle changes, such as: ? Eating healthy foods. ? Exercising more. ? Drinking less alcohol.  Your health care provider may prescribe medicine if lifestyle changes are not enough to get your blood pressure under control, and if: ? Your top number is above 130. ? Your bottom number is above 80.  Your personal target blood pressure may vary. Follow these instructions at home: Eating and drinking   If told, follow the DASH eating plan. To follow this plan: ? Fill one half of your plate at each meal with fruits and vegetables. ? Fill one fourth of your plate at each meal with whole grains. Whole grains include whole-wheat pasta, brown rice, and whole-grain bread. ? Eat or drink low-fat dairy products, such as skim milk or low-fat yogurt. ? Fill one fourth of your plate at each meal with low-fat (lean) proteins. Low-fat proteins include fish, chicken without skin, eggs, beans, and tofu. ? Avoid fatty meat, cured and processed meat, or chicken with skin. ? Avoid pre-made or processed food.  Eat less than 1,500 mg of salt each day.  Do not drink alcohol if: ? Your doctor tells you not to drink. ? You are pregnant, may be pregnant, or are planning to become  pregnant.  If you drink alcohol: ? Limit how much you use to:  0-1 drink a day for women.  0-2 drinks a day for men. ? Be aware of how much alcohol is in your drink. In the [place] one drink equals one 12 oz bottle of beer (355 mL), one 5 oz glass of wine (148 mL), or one 1 oz glass of hard liquor (44 mL). Lifestyle   Work with your doctor to stay at a healthy weight or to lose weight. Ask your  doctor what the best weight is for you.  Get at least 30 minutes of exercise most days of the week. This may include walking, swimming, or biking.  Get at least 30 minutes of exercise that strengthens your muscles (resistance exercise) at least 3 days a week. This may include lifting weights or doing Pilates.  Do not use any products that contain nicotine or tobacco, such as cigarettes, e-cigarettes, and chewing tobacco. If you need help quitting, ask your doctor.  Check your blood pressure at home as told by your doctor.  Keep all follow-up visits as told by your doctor. This is important. Medicines  Take over-the-counter and prescription medicines only as told by your doctor. Follow directions carefully.  Do not skip doses of blood pressure medicine. The medicine does not work as well if you skip doses. Skipping doses also puts you at risk for problems.  Ask your doctor about side effects or reactions to medicines that you should watch for. Contact a doctor if you:  Think you are having a reaction to the medicine you are taking.  Have headaches that keep coming back (recurring).  Feel dizzy.  Have swelling in your ankles.  Have trouble with your vision. Get help right away if you:  Get a very bad headache.  Start to feel mixed up (confused).  Feel weak or numb.  Feel faint.  Have very bad pain in your: ? Chest. ? Belly (abdomen).  Throw up more than once.  Have trouble breathing. Summary  Hypertension is another name for high blood pressure.  High blood pressure forces your heart to work harder to pump blood.  For most people, a normal blood pressure is less than 120/80.  Making healthy choices can help lower blood pressure. If your blood pressure does not get lower with healthy choices, you may need to take medicine. This information is not intended to replace advice given to you by your health care provider. Make sure you discuss any questions you have with  your health care provider. Document Released: 09/24/2007 Document Revised: 12/16/2017 Document Reviewed: 12/16/2017 Elsevier Patient Education  2020 [employer].

## 2019-02-01 NOTE — Progress Notes (Signed)
Maria Greene to be D/C'd Home per MD order.  Discussed with the patient and all questions fully answered.   VSS, Skin clean, dry and intact without evidence of skin break down, no evidence of skin tears noted. IV catheter discontinued intact. Site without signs and symptoms of complications. Dressing and pressure applied.   An After Visit Summary was printed and given to the patient.    D/C education completed with patient/family including follow up instructions, medication list, d/c activities limitations if indicated, with other d/c instructions as indicated by MD - patient able to verbalize understanding, all questions fully answered.    Patient instructed to return to ED, call 911, or call MD for any changes in condition.    Patient escorted via [place], and D/C home via private Car.

## 2019-02-01 NOTE — Plan of Care (Signed)
  Problem: Pain Managment: Goal: General experience of comfort will improve Outcome: Progressing   Problem: Safety: Goal: Ability to remain free from injury will improve Outcome: Progressing   Problem: Skin Integrity: Goal: Risk for impaired skin integrity will decrease Outcome: Progressing   

## 2019-02-01 NOTE — Progress Notes (Signed)
   Subjective:  Maria Greene was seen at bedside this morning. She states that she feels fine,  She denies nausea, vomiting, visual changes, or chest pain. We explained that the Excedrin she has been using has caffeine in it, which may raise her blood pressure. We also spoke about adding amlodipine 5 mg to her hypertensive medication.    Objective:  Vital signs in last 24 hours: Vitals:   01/31/19 1829 01/31/19 2033 01/31/19 2354 02/01/19 0503  BP: (!) 185/117 (!) 154/94 (!) 178/92 (!) 158/104  Pulse: 93 88 90 75  Resp: 16   18  Temp: 98.1 F (36.7 C)  98.8 F (37.1 C) 97.7 F (36.5 C)  TempSrc: Oral  Oral Oral  SpO2: 95%  94% 95%  Weight:      Height:      Physical Exam Vitals signs and nursing note reviewed.  Constitutional:      General: She is not in acute distress.    Appearance: She is obese. She is not ill-appearing, toxic-appearing or diaphoretic.  HENT:     Head: Normocephalic and atraumatic.  Cardiovascular:     Rate and Rhythm: Normal rate and regular rhythm.     Heart sounds: Murmur present. Systolic murmur present with a grade of 2/6. No friction rub. No gallop.   Pulmonary:     Effort: Pulmonary effort is normal. No tachypnea.     Breath sounds: Normal breath sounds. No wheezing, rhonchi or rales.  Abdominal:     General: Bowel sounds are normal.     Palpations: Abdomen is soft. There is no mass.     Tenderness: There is no abdominal tenderness.  Neurological:     Mental Status: She is alert.  Psychiatric:        Mood and Affect: Mood normal.        Behavior: Behavior normal.     Assessment/Plan:  Active Problems:   Hypertensive urgency  Hypertensive Urgency: - Continue Telemetry  - Ordered amlodipine 5 mg - Continue Chlorthalidone 25 mg - ContinueLisinopril to 40 mg - Labetolol 10mg  PRN for BP >180/110  - Will Follow up with IMTS clinic for medication optimization.   # HFrEF  - Monitor for increase in volume status - Plan for a TTE in the  outpatient setting  # Hyperlipidemia - Lipid: HDL 30 with LDL cholesterol of 99 - Atorvastatin 20mg   Dispo: Anticipated discharge in approximately 1 day, may be today if blood pressures stabilize. Maudie Mercury, MD 02/01/2019, 11:20 AM [phone]

## 2019-02-02 NOTE — Discharge Summary (Signed)
Name: Maria Greene MRN: [id number] DOB: 30-Mar-1961 58 y.o. PCP: Sid Falcon, MD  Date of Admission: 01/30/2019  9:50 AM Date of Discharge: 02/01/2019 Attending Physician: Larey Dresser, MD Discharge Diagnosis: 1. Hypertensive Urgency  2. Congestive Heart Failure Reduced Ejection Fraction 3. Hyperlipidemia  Discharge Medications: Allergies as of 02/01/2019      Reactions   Percocet [oxycodone-acetaminophen] Nausea And Vomiting      Medication List    STOP taking these medications   HYDROcodone-acetaminophen 5-325 MG tablet Commonly known as: NORCO/VICODIN   naproxen 375 MG tablet Commonly known as: NAPROSYN     TAKE these medications   acetaminophen 500 MG tablet Commonly known as: TYLENOL Take 1,000 mg by mouth every 6 (six) hours as needed for mild pain or headache.   amLODipine 5 MG tablet Commonly known as: NORVASC Take 1 tablet (5 mg total) by mouth daily.   atorvastatin 40 MG tablet Commonly known as: LIPITOR Take 1 tablet (40 mg total) by mouth daily. What changed:   medication strength  how much to take   chlorthalidone 25 MG tablet Commonly known as: HYGROTON Take 1 tablet (25 mg total) by mouth daily.   ibuprofen 400 MG tablet Commonly known as: ADVIL Take 1 tablet (400 mg total) by mouth every 6 (six) hours as needed for moderate pain. What changed:   medication strength  how much to take  reasons to take this   lisinopril 40 MG tablet Commonly known as: ZESTRIL Take 1 tablet (40 mg total) by mouth daily. What changed:   medication strength  how much to take  how to take this  when to take this  additional instructions   Multi For Her 50+ Tabs Take 1 tablet by mouth daily.       Disposition and follow-up:   [name] was discharged from [facility] in Stable condition.  At the hospital follow up visit please address:  1.  Medication optimization for her blood pressure. If blood pressure is  not controlled by lisinopril, HCTZ, and amlodipine, think about addition of spironolactone as that would help with hypokalemia. Possible workup for hyperaldosteronism.   2.  Labs / imaging needed at time of follow-up: N/A  3.  Pending labs/ test needing follow-up: BMP follow potassium.  Follow-up Appointments: Follow-up Information    Sid Falcon, MD Follow up.   Specialty: Internal Medicine Contact information: [place] [id number] [place] Hospital Course by problem list: 1. Hypertensive Urgency:  Patient presented to [facility] ED, with intermittent chest pain for a week in duration, which she attributed to a death of three family members the weeks before her symptoms. She came to the ED after developing a severe headache over a 24 hour period. Her blood pressure was found to be 203/103 in the ED. She states that she was taking her home lisinopril at the time. She was started on her home lisinopril. She was also prescribed chlorthalidone and metoprolol, which she was not taking. She started her chlorthalidone 25 mg amd amlodipine 5 mg was added. She was discharged with the resolution of her symptoms. She will follow up with her PCP within 1-2 weeks for optimization of her anti hypertensive medications. She was discharged with amlodipine, chlorthalidone, and an increased dose of her lisinopril to 40 mg.   2. Congestive Heart Failure Reduced Ejection Fraction:  Her last Echo was dated from 03/2018 which  showed a reduced EF of 45-50% with mild hypokinesis and grade 2 diastolic dysfunction. She was lost to follow up. She was prescribed lasix for her HFrEF, but she never picked it up. During her initial exam she was mildly volume up with increased JVD and bilateral crackles auscultated during her pulmonary exam. A repeat ECHO was ordered which showed similar findings.   3. Hyperlipidemia:  Lipid panel on 03/2018 showed a LDL of 109. She was not taking  statins, and was started on atorvastatin 40 mg.   Discharge Vitals:   BP (!) 193/113 (BP Location: Left Arm)   Pulse 87   Temp 98 F (36.7 C) (Oral)   Resp 17   Ht 5\' 2"  (1.575 m)   Wt (!) 138 kg   SpO2 97%   BMI 55.65 kg/m   Pertinent Labs, Studies, and Procedures:   Echo with bubble study:  1. Left ventricular ejection fraction, by visual estimation, is 55 to 60%. The left ventricle has normal function. Normal left ventricular size. There is severely increased left ventricular hypertrophy. 2. Left ventricular diastolic Doppler parameters are consistent with impaired relaxation pattern of LV diastolic filling. 3. Global right ventricle has normal systolic function.The right ventricular size is normal. 4. Left atrial size was mildly dilated. 5. Right atrial size was normal. 6. The mitral valve is normal in structure. Trace mitral valve regurgitation. No evidence of mitral stenosis. 7. The tricuspid valve is normal in structure. Tricuspid valve regurgitation is trivial. 8. The aortic valve is tricuspid Aortic valve regurgitation was not visualized by color flow Doppler. Mild aortic valve sclerosis without stenosis. 9. The pulmonic valve was normal in structure. Pulmonic valve regurgitation is not visualized by color flow Doppler. 10. Aortic dilatation noted. 11. There is mild dilatation of the aortic root measuring 39 mm. 12. The inferior vena cava is normal in size with greater than 50% respiratory variability, suggesting right atrial pressure of 3 mmHg. 13. Normal LV systolic function; severe LVH; grade 1 distolic dysfunction; mildly dilated aortic root; mild LAE; negative saline microcavitation study.  CT Head WO Contrast EXAM: CT HEAD WITHOUT CONTRAST  TECHNIQUE: Contiguous axial images were obtained from the base of the skull through the vertex without intravenous contrast.  COMPARISON:  None.  FINDINGS: Brain: No evidence of acute infarction, hemorrhage,  hydrocephalus, extra-axial collection or mass lesion/mass effect.  Vascular: No hyperdense vessel or unexpected calcification.  Skull: Normal. Negative for fracture or focal lesion.  Sinuses/Orbits: Negative.  Other: None.  DG Chest 1 View IMPRESSION: No acute intracranial abnormality.  FINDINGS: Stable mediastinal contours. Heart size appears mildly enlarged compared to prior likely accentuated by AP portable technique and lower lung volumes. The lungs are clear. No pneumothorax or large pleural effusion. No acute finding in the visualized skeleton.  IMPRESSION: 1.  No evidence of active disease in the chest.  2. Heart size appears mildly enlarged compared to prior which may be accentuated by AP portable technique and lower lung volumes.  Discharge Instructions: Discharge Instructions    Diet - low sodium heart healthy   Complete by: As directed    Increase activity slowly   Complete by: As directed       Signed: , MD 02/02/2019, 8:16 PM   Pager: [phone]

## 2019-02-14 ENCOUNTER — Ambulatory Visit: Payer: BLUE CROSS/BLUE SHIELD

## 2019-02-15 ENCOUNTER — Encounter: Payer: Self-pay | Admitting: Internal Medicine

## 2019-02-15 ENCOUNTER — Ambulatory Visit (INDEPENDENT_AMBULATORY_CARE_PROVIDER_SITE_OTHER): Payer: BLUE CROSS/BLUE SHIELD | Admitting: Internal Medicine

## 2019-02-15 ENCOUNTER — Other Ambulatory Visit: Payer: Self-pay

## 2019-02-15 VITALS — BP 181/106 | HR 94 | Temp 98.6°F | Wt 285.8 lb

## 2019-02-15 DIAGNOSIS — M25562 Pain in left knee: Secondary | ICD-10-CM | POA: Diagnosis not present

## 2019-02-15 DIAGNOSIS — M25561 Pain in right knee: Secondary | ICD-10-CM | POA: Diagnosis not present

## 2019-02-15 DIAGNOSIS — G8929 Other chronic pain: Secondary | ICD-10-CM

## 2019-02-15 DIAGNOSIS — E876 Hypokalemia: Secondary | ICD-10-CM

## 2019-02-15 DIAGNOSIS — I10 Essential (primary) hypertension: Secondary | ICD-10-CM

## 2019-02-15 DIAGNOSIS — Z79899 Other long term (current) drug therapy: Secondary | ICD-10-CM

## 2019-02-15 DIAGNOSIS — Z6841 Body Mass Index (BMI) 40.0 and over, adult: Secondary | ICD-10-CM

## 2019-02-15 MED ORDER — LISINOPRIL 40 MG PO TABS: 40.0000 mg | ORAL_TABLET | Freq: Every day | ORAL | 2 refills | Status: DC

## 2019-02-15 MED ORDER — AMLODIPINE BESYLATE 10 MG PO TABS: 10.0000 mg | ORAL_TABLET | Freq: Every day | ORAL | 2 refills | Status: DC

## 2019-02-15 MED ORDER — SPIRONOLACTONE 25 MG PO TABS: 25.0000 mg | ORAL_TABLET | Freq: Every day | ORAL | 2 refills | Status: DC

## 2019-02-15 NOTE — Assessment & Plan Note (Signed)
Patient presents after recent hospitalization for hypertensive urgency. She has previous admissions document for hypertension. BP is 181/106 and approximately the same on recheck. She is currently taking Lisinopril 40 mg and Amlodipine 5 mg. Prescribed Chlorthalidone on hospital discharge, but patient is not aware.  Assessment: Hypertension  Plan:  - Recommended Dash diet and exercise - Continue Lisinopril 40 mg - Increase Amlodipine 5 mg to 10 mg -  Start Spirolactone 25 mg - BMP and BP check in 1 week

## 2019-02-15 NOTE — Patient Instructions (Addendum)
Thank you for trusting me with your care. To recap, today we discussed the following:   High blood pressure - Continue Lisinopril 40 mg - Increase Amlodipine to 10 mg - Start Spirolactone 25 mg - Follow up in one week for lab work ,basic metabolic panel  Weigh Loss - Schedule meeting with Butch Penny next week to discuss starting Liraglutide  Referral to orthopedic doctor to discuss knee surgery

## 2019-02-15 NOTE — Progress Notes (Signed)
   CC: Hypertension, bilateral knee pain  HPI:  [name] is a 58 y.o. with past medical history below. Please see problem based charting for details of presentation, assessment, and plan.    Past Medical History:  Diagnosis Date  . CHF (congestive heart failure) ([place])   . Dyspnea 03/29/2018  . Hypertension   . Hypertensive urgency 03/29/2018   Review of Systems:  Review of Systems  Constitutional: Negative for chills and fever.  Cardiovascular: Positive for leg swelling (Chronic, better today). Negative for chest pain.    Physical Exam:  Vitals:   02/15/19 1423  BP: (!) 181/106  Pulse: 94  Temp: 98.6 F (37 C)  TempSrc: Oral  SpO2: 98%  Weight: 285 lb 12.8 oz (129.6 kg)   Physical Exam Constitutional:      General: She is not in acute distress.    Appearance: She is obese.  Cardiovascular:     Rate and Rhythm: Normal rate and regular rhythm.  Pulmonary:     Effort: No respiratory distress.     Breath sounds: No wheezing, rhonchi or rales.  Abdominal:     General: Bowel sounds are normal.     Tenderness: There is no abdominal tenderness.  Musculoskeletal:     Right lower leg: Edema present.     Left lower leg: Edema present.  Neurological:     Mental Status: She is alert.  Psychiatric:        Mood and Affect: Mood normal.        Behavior: Behavior normal.      Assessment & Plan:   See Encounters Tab for problem based charting.  Patient seen with Dr. Lynnae January

## 2019-02-16 NOTE — Progress Notes (Signed)
Internal Medicine Clinic Attending  I saw and evaluated the patient.  I personally confirmed the key portions of the history and exam documented by Dr. Court Joy and I reviewed pertinent patient test results.  The assessment, diagnosis, and plan were formulated together and I agree with the documentation in the resident's note.   She has poorly controlled hypertension despite 2 medicines (but no diuretic), relative /normal hypokalemia despite being on an ACE inhibitor, and long standing hypertension.  The relative hypokalemia makes secondary hypertension / hyper Aldo a possibility.  Working this up is not going to change her management as due to her long duration of hypertension, surgical intervention is unlikely to be successful.  Therefore, we will start spironolactone which will treat hyper Aldo if she were to have it and also help her K and BP.

## 2019-02-22 ENCOUNTER — Other Ambulatory Visit: Payer: Self-pay

## 2019-02-22 ENCOUNTER — Ambulatory Visit: Payer: BLUE CROSS/BLUE SHIELD | Admitting: Dietician

## 2019-02-22 ENCOUNTER — Ambulatory Visit (INDEPENDENT_AMBULATORY_CARE_PROVIDER_SITE_OTHER): Payer: BLUE CROSS/BLUE SHIELD | Admitting: Internal Medicine

## 2019-02-22 VITALS — BP 143/88 | HR 95 | Temp 98.8°F | Ht 66.0 in | Wt 281.3 lb

## 2019-02-22 DIAGNOSIS — Z79899 Other long term (current) drug therapy: Secondary | ICD-10-CM

## 2019-02-22 DIAGNOSIS — I509 Heart failure, unspecified: Secondary | ICD-10-CM

## 2019-02-22 DIAGNOSIS — I110 Hypertensive heart disease with heart failure: Secondary | ICD-10-CM | POA: Diagnosis not present

## 2019-02-22 DIAGNOSIS — I10 Essential (primary) hypertension: Secondary | ICD-10-CM | POA: Diagnosis not present

## 2019-02-22 LAB — BASIC METABOLIC PANEL
Anion gap: 9 (ref 5–15)
BUN: 15 mg/dL (ref 6–20)
CO2: 26 mmol/L (ref 22–32)
Calcium: 8.6 mg/dL — ABNORMAL LOW (ref 8.9–10.3)
Chloride: 103 mmol/L (ref 98–111)
Creatinine, Ser: 0.93 mg/dL (ref 0.44–1.00)
GFR calc Af Amer: >60 mL/min (ref >60)
GFR calc non Af Amer: >60 mL/min (ref >60)
Glucose, Bld: 96 mg/dL (ref 70–99)
Potassium: 3.7 mmol/L (ref 3.5–5.1)
Sodium: 138 mmol/L (ref 135–145)

## 2019-02-22 MED ORDER — SPIRONOLACTONE 50 MG PO TABS: 50.0000 mg | ORAL_TABLET | Freq: Every day | ORAL | 0 refills | Status: DC

## 2019-02-22 NOTE — Progress Notes (Signed)
   CC: High Blood Pressure  HPI:  [name] is a 58 y.o. with a PMH of CHF, HTN, who presents to the clinic for her blood pressure. To see the management of [name]'s assessment and plan under the encounters tab.   Past Medical History:  Diagnosis Date  . CHF (congestive heart failure) ([place])   . Dyspnea 03/29/2018  . Hypertension   . Hypertensive urgency 03/29/2018   Review of Systems:  Review of Systems  Constitutional: Negative for chills, fever and malaise/fatigue.  Eyes: Negative for blurred vision, double vision and photophobia.  Respiratory: Negative for cough and wheezing.   Cardiovascular: Negative for chest pain and palpitations.  Gastrointestinal: Negative for abdominal pain, constipation, diarrhea, nausea and vomiting.  Genitourinary: Negative for dysuria, frequency and urgency.  Neurological: Negative for dizziness, weakness and headaches.     Physical Exam: Physical Exam Constitutional:      General: She is not in acute distress.    Appearance: Normal appearance. She is not ill-appearing or toxic-appearing.     Comments: Patient appears stated age, sitting comfortably in her chair with no signs of acute distress.   HENT:     Head: Normocephalic and atraumatic.  Cardiovascular:     Rate and Rhythm: Normal rate and regular rhythm.     Pulses: Normal pulses.     Heart sounds: Normal heart sounds. No murmur. No friction rub. No gallop.   Pulmonary:     Effort: Pulmonary effort is normal.     Breath sounds: Normal breath sounds. No wheezing, rhonchi or rales.  Neurological:     General: No focal deficit present.     Mental Status: She is alert and oriented to person, place, and time.  Psychiatric:        Mood and Affect: Mood normal.        Behavior: Behavior normal.     Vitals:   02/22/19 1404  BP: (!) 143/88  Pulse: 95  Temp: 98.8 F (37.1 C)  TempSrc: Oral  SpO2: 97%  Weight: 281 lb 4.8 oz (127.6 kg)  Height: 5\' 6"  (1.676 m)     Assessment  & Plan:   See Encounters Tab for problem based charting.  Patient seen with Dr. Lynnae January

## 2019-02-22 NOTE — Assessment & Plan Note (Addendum)
HPI:  [name] states that she is feeling well. She admits that she had accidentally taken her amlodipine 5mg  in tandem with her 10 mg dose. Her blood pressure today was 143/88, which may be due to starting her spironolactone regime last week.  Assessment/Plan:   Patient continues to have her blood pressure trend down. Today's reading were 143/88. For further control we will increase her spironolactone and have her followup in 2 weeks.  - Increase spironolactone to 50 mg - Continue Amlodipine 10 mg - Continue Lisinopril 40 mg  - Follow up in 2 weeks for blood pressure check.

## 2019-02-22 NOTE — Patient Instructions (Addendum)
Maria Greene,  It was pleasure seeing you today! Today we spoke about your blood pressure medication. We spoke about stopping your amlodipine 5mg  and continuing your amlodipine 10 mg. We discussed increasing your spironolactone from 25 mg to 50 mg. We will see you back in 2 weeks and reassess your blood pressure. I look forward to working with you again. Have a great day! Sincerely,  Dr. Maudie Mercury

## 2019-02-23 NOTE — Progress Notes (Signed)
Internal Medicine Clinic Attending  I saw and evaluated the patient.  I personally confirmed the key portions of the history and exam documented by Dr. Winters and I reviewed pertinent patient test results.  The assessment, diagnosis, and plan were formulated together and I agree with the documentation in the resident's note.  

## 2019-03-08 ENCOUNTER — Encounter: Payer: Self-pay | Admitting: Internal Medicine

## 2019-03-08 ENCOUNTER — Ambulatory Visit: Payer: BLUE CROSS/BLUE SHIELD | Admitting: Dietician

## 2019-03-08 ENCOUNTER — Ambulatory Visit (INDEPENDENT_AMBULATORY_CARE_PROVIDER_SITE_OTHER): Payer: BLUE CROSS/BLUE SHIELD | Admitting: Internal Medicine

## 2019-03-08 ENCOUNTER — Other Ambulatory Visit: Payer: Self-pay

## 2019-03-08 VITALS — BP 147/98 | HR 87 | Temp 98.6°F | Ht 66.0 in | Wt 284.5 lb

## 2019-03-08 DIAGNOSIS — Z79899 Other long term (current) drug therapy: Secondary | ICD-10-CM | POA: Diagnosis not present

## 2019-03-08 DIAGNOSIS — I10 Essential (primary) hypertension: Secondary | ICD-10-CM | POA: Diagnosis not present

## 2019-03-08 DIAGNOSIS — Z791 Long term (current) use of non-steroidal anti-inflammatories (NSAID): Secondary | ICD-10-CM | POA: Diagnosis not present

## 2019-03-08 DIAGNOSIS — M25569 Pain in unspecified knee: Secondary | ICD-10-CM | POA: Diagnosis not present

## 2019-03-08 LAB — BASIC METABOLIC PANEL
Anion gap: 11 (ref 5–15)
BUN: 13 mg/dL (ref 6–20)
CO2: 26 mmol/L (ref 22–32)
Calcium: 9.1 mg/dL (ref 8.9–10.3)
Chloride: 101 mmol/L (ref 98–111)
Creatinine, Ser: 0.79 mg/dL (ref 0.44–1.00)
GFR calc Af Amer: >60 mL/min (ref >60)
GFR calc non Af Amer: >60 mL/min (ref >60)
Glucose, Bld: 103 mg/dL — ABNORMAL HIGH (ref 70–99)
Potassium: 3.5 mmol/L (ref 3.5–5.1)
Sodium: 138 mmol/L (ref 135–145)

## 2019-03-08 MED ORDER — ATORVASTATIN CALCIUM 40 MG PO TABS: 40.0000 mg | ORAL_TABLET | Freq: Every day | ORAL | 3 refills | Status: DC

## 2019-03-08 NOTE — Progress Notes (Signed)
   CC: Hypertension  HPI:  [name] is a 58 y.o., with a PMH noted below, who presents to the clinic for a follow up on her hypertension. To see the management of her acute and chronic conditions please see the A&P note under the Encounters tab.   Past Medical History:  Diagnosis Date  . CHF (congestive heart failure) ([place])   . Dyspnea 03/29/2018  . Hypertension   . Hypertensive urgency 03/29/2018   Review of Systems:   Review of Systems  Constitutional: Positive for malaise/fatigue. Negative for chills, diaphoresis, fever and weight loss.  HENT: Negative for hearing loss and tinnitus.   Eyes: Negative for blurred vision, double vision and photophobia.  Respiratory: Negative for cough, hemoptysis, sputum production and shortness of breath.   Cardiovascular: Negative for chest pain and palpitations.  Gastrointestinal: Negative for abdominal pain, blood in stool, constipation, diarrhea, nausea and vomiting.  Genitourinary: Negative for dysuria, frequency, hematuria and urgency.  Musculoskeletal: Negative for back pain, joint pain, myalgias and neck pain.  Neurological: Negative for dizziness, tingling, tremors and headaches.    Physical Exam:  Vitals:   03/08/19 1417  BP: (!) 154/98  Pulse: 87  Temp: 98.6 F (37 C)  TempSrc: Oral  SpO2: 98%  Weight: 284 lb 8 oz (129 kg)  Height: 5\' 6"  (1.676 m)   Physical Exam Constitutional:      General: She is not in acute distress.    Appearance: Normal appearance. She is obese. She is not ill-appearing or toxic-appearing.  HENT:     Head: Normocephalic and atraumatic.  Cardiovascular:     Rate and Rhythm: Normal rate and regular rhythm.     Pulses: Normal pulses.     Heart sounds: Normal heart sounds. No murmur. No friction rub. No gallop.   Pulmonary:     Effort: Pulmonary effort is normal. No respiratory distress.     Breath sounds: Normal breath sounds. No wheezing or rhonchi.  Abdominal:     General: Abdomen is flat.  Bowel sounds are normal. There is no distension.     Palpations: Abdomen is soft.     Tenderness: There is no abdominal tenderness. There is no guarding.  Musculoskeletal:        General: No tenderness.     Right lower leg: No edema.     Left lower leg: No edema.     Comments: Trace pitting edema present in the lower limbs bilaterally.   Skin:    General: Skin is warm and dry.  Neurological:     Mental Status: She is alert.     Assessment & Plan:   See Encounters Tab for problem based charting.  Patient seen with Dr. Evette Doffing

## 2019-03-08 NOTE — Patient Instructions (Signed)
Ms. Faivre,   It was a pleasure to meet you again. We discussed your hypertension today. Please take your motrin sparingly. We will get some blood work today to check your thyroid and potassium. We will also get a home sleep study to test you for sleep apnea. Your medications have been filled at the [facility]. We look forward to working with you again! Sincerely,  Maudie Mercury MD

## 2019-03-08 NOTE — Assessment & Plan Note (Signed)
Patient's hypertension is uncontrolled despite being on 3 anti-hypertensive medications. Patient does endorse some recent stressful events including deaths in the family last month, working late on the third shift, and watching her grandchild and helping with their virtual learning in the morning to afternoon.   Patient denies tobacco or alcohol use. Patient states that she does drink coffee during her night shifts, and does intermittently use motrin for knee pain. Patient does score a 6 on the stop-bang score for Obstructive Sleep Apnea. Will continue to work up her hypertension by screening for a TSH and home sleep study.   Plan:  - Patient to continue taking current anti hypertensive medications.  - TSH ordered. - Home sleep study ordered.

## 2019-03-09 LAB — TSH: TSH: 1.24 u[IU]/mL (ref 0.450–4.500)

## 2019-03-09 NOTE — Progress Notes (Signed)
Maria Greene rescheduled this visit.

## 2019-03-09 NOTE — Progress Notes (Signed)
Internal Medicine Clinic Attending  I saw and evaluated the patient.  I personally confirmed the key portions of the history and exam documented by Dr. Winters and I reviewed pertinent patient test results.  The assessment, diagnosis, and plan were formulated together and I agree with the documentation in the resident's note.  

## 2019-03-22 ENCOUNTER — Ambulatory Visit: Payer: BLUE CROSS/BLUE SHIELD | Admitting: Dietician

## 2019-05-06 ENCOUNTER — Other Ambulatory Visit: Payer: Self-pay

## 2019-05-06 ENCOUNTER — Ambulatory Visit (INDEPENDENT_AMBULATORY_CARE_PROVIDER_SITE_OTHER): Payer: BLUE CROSS/BLUE SHIELD | Admitting: Internal Medicine

## 2019-05-06 ENCOUNTER — Encounter: Payer: Self-pay | Admitting: Internal Medicine

## 2019-05-06 VITALS — BP 160/90 | HR 86 | Temp 98.4°F | Wt 291.1 lb

## 2019-05-06 DIAGNOSIS — M25562 Pain in left knee: Secondary | ICD-10-CM

## 2019-05-06 DIAGNOSIS — I110 Hypertensive heart disease with heart failure: Secondary | ICD-10-CM

## 2019-05-06 DIAGNOSIS — I5032 Chronic diastolic (congestive) heart failure: Secondary | ICD-10-CM

## 2019-05-06 DIAGNOSIS — Z6841 Body Mass Index (BMI) 40.0 and over, adult: Secondary | ICD-10-CM | POA: Diagnosis not present

## 2019-05-06 DIAGNOSIS — G8929 Other chronic pain: Secondary | ICD-10-CM

## 2019-05-06 DIAGNOSIS — Z90710 Acquired absence of both cervix and uterus: Secondary | ICD-10-CM

## 2019-05-06 DIAGNOSIS — Z1231 Encounter for screening mammogram for malignant neoplasm of breast: Secondary | ICD-10-CM

## 2019-05-06 DIAGNOSIS — I10 Essential (primary) hypertension: Secondary | ICD-10-CM | POA: Diagnosis not present

## 2019-05-06 DIAGNOSIS — R7309 Other abnormal glucose: Secondary | ICD-10-CM

## 2019-05-06 DIAGNOSIS — D649 Anemia, unspecified: Secondary | ICD-10-CM

## 2019-05-06 DIAGNOSIS — Z0000 Encounter for general adult medical examination without abnormal findings: Secondary | ICD-10-CM | POA: Diagnosis not present

## 2019-05-06 DIAGNOSIS — M25561 Pain in right knee: Secondary | ICD-10-CM | POA: Diagnosis not present

## 2019-05-06 DIAGNOSIS — Z79899 Other long term (current) drug therapy: Secondary | ICD-10-CM

## 2019-05-06 LAB — GLUCOSE, CAPILLARY: Glucose-Capillary: 94 mg/dL (ref 70–99)

## 2019-05-06 LAB — POCT GLYCOSYLATED HEMOGLOBIN (HGB A1C): Hemoglobin A1C: 5.7 % — AB (ref 4.0–5.6)

## 2019-05-06 MED ORDER — SPIRONOLACTONE 50 MG PO TABS: 50.0000 mg | ORAL_TABLET | Freq: Every day | ORAL | 1 refills | Status: DC

## 2019-05-06 MED ORDER — DULOXETINE HCL 30 MG PO CPEP: 30.0000 mg | ORAL_CAPSULE | Freq: Every day | ORAL | 6 refills | Status: DC

## 2019-05-06 NOTE — Patient Instructions (Addendum)
Ms. Coombes - -  It was a pleasure to see you today!  For your knee pain - -  Please try Voltaren Gel (over the counter at the drugstore).  Use as directed Also, please start Duloxetine 13m daily for pain.  More information below.   We will also need to help with weight loss.  I have [facility].    For colon cancer screening - -   Please return the FIT test when you are able.    You had a normal mammogram 4 years ago, I have placed an order to reschedule this.   Please take Spironolactone 523mdaily.    Thank you!!   Stool DNA Testing for Colon Cancer Why am I having this test? Colon cancer is the second-leading cause of cancer deaths in men and women. Testing for colon cancer before symptoms develop (screening) reduces your risk for this cancer. Stool DNA testing, also called the FIT-DNA test, is one method of screening for colon cancer. Colon cancer grows slowly, so finding the cancer early means a better chance for effective treatment. All adults should have colon cancer screening starting at age 4272nd continuing until age 2763Your health care provider may recommend screening at age 6558Screening may include having stool DNA testing every 3 years if:  You have no symptoms of colon cancer. Symptoms include rectal bleeding, weight loss, and changes in bowel habits.  You have an average risk of colon cancer. Average risk means: ? You do not have precancerous polyps. ? You do not have family or personal history of either colon cancer or a colon disease that increases your risk for colon cancer. What is being tested? For the test, a sample of your stool (feces) is checked for blood and changes in DNA that could lead to cancer. Growths in your colon that are cancerous (malignant) or may become cancerous (precancerous polyps) bleed and shed cells. Blood and cells can be picked up by stool as it passes through your colon. This test checks for blood  cells as well as nine types of DNA (biomarkers) in three genes that have been linked to colon cancer and precancerous polyps. What kind of sample is taken?  This test uses a stool sample that is collected when you have a bowel movement. How do I collect samples at home? You will be sent a stool sample collection kit in the mail. It will include instructions and everything you need to get the sample. Instructions may include these steps:  Store the kit in a dry place at room temperature until you are ready to collect the sample. Keep the kit away from heat and sunlight.  To collect the sample, place the collection device over the toilet.  Collect the sample according to the instructions that came with your kit.  After collecting a stool sample, follow instructions carefully regarding mixing the stool with a preservative and sealing the sample.  Send the sample to the lab in the postage-paid box that was included in the kit. Your stool sample will be checked within 3 days. The results will be sent to your health care provider. How do I prepare for this test? There is no preparation required for this test. However, do not collect a stool sample if:  You have bleeding hemorrhoids.  You have any rectal bleeding.  You have a cut on your hand or finger.  You have your menstrual period.  You have diarrhea. How  are the results reported? Your test results will be reported as either positive or negative. It is up to you to get your test results. Ask your health care provider, or the department that is doing the test, when your results will be ready. What do the results mean?  A positive result means that the test found abnormal DNA, blood cells, or both. If you have a positive result, you will need to have a follow-up exam of your colon done with a scope (colonoscopy).  A negative result means that no blood or changes in DNA were found. This does not guarantee that you do not have colon  cancer. Your health care provider may recommend that you have other screening tests. Talk with your health care provider to discuss your results, treatment options, and if necessary, the need for more tests. Talk with your health care provider if you have any questions about your results. Questions to ask [your / your child's] health care provider Ask [your / your child's] health care provider, or the department that is doing the test: This information is not intended to replace advice given to you by your health care provider. Make sure you discuss any questions you have with your health care provider. Document Revised: 07/29/2018 Document Reviewed: 12/27/2015 Elsevier Patient Education  [place].  Duloxetine delayed-release capsules What is this medicine? DULOXETINE (doo LOX e teen) is used to treat depression, anxiety, and different types of chronic pain. This medicine may be used for other purposes; ask your health care provider or pharmacist if you have questions. COMMON BRAND NAME(S): Cymbalta, Creig Hines, Irenka What should I tell my health care provider before I take this medicine? They need to know if you have any of these conditions:  bipolar disorder  glaucoma  high blood pressure  kidney disease  liver disease  seizures  suicidal thoughts, plans or attempt; a previous suicide attempt by you or a family member  take medicines that treat or prevent blood clots  taken medicines called MAOIs like Carbex, Eldepryl, Marplan, Nardil, and Parnate within 14 days  trouble passing urine  an unusual reaction to duloxetine, other medicines, foods, dyes, or preservatives  pregnant or trying to get pregnant  breast-feeding How should I use this medicine? Take this medicine by mouth with a glass of water. Follow the directions on the prescription label. Do not crush, cut or chew some capsules of this medicine. Some capsules may be opened and sprinkled on applesauce.  Check with your doctor or pharmacist if you are not sure. You can take this medicine with or without food. Take your medicine at regular intervals. Do not take your medicine more often than directed. Do not stop taking this medicine suddenly except upon the advice of your doctor. Stopping this medicine too quickly may cause serious side effects or your condition may worsen. A special MedGuide will be given to you by the pharmacist with each prescription and refill. Be sure to read this information carefully each time. Talk to your pediatrician regarding the use of this medicine in children. While this drug may be prescribed for children as young as 62 years of age for selected conditions, precautions do apply. Overdosage: If you think you have taken too much of this medicine contact a poison control center or emergency room at once. NOTE: This medicine is only for you. Do not share this medicine with others. What if I miss a dose? If you miss a dose, take it as soon as  you can. If it is almost time for your next dose, take only that dose. Do not take double or extra doses. What may interact with this medicine? Do not take this medicine with any of the following medications:  desvenlafaxine  levomilnacipran  linezolid  MAOIs like Carbex, Eldepryl, Marplan, Nardil, and Parnate  methylene blue (injected into a vein)  milnacipran  thioridazine  venlafaxine This medicine may also interact with the following medications:  alcohol  amphetamines  aspirin and aspirin-like medicines  certain antibiotics like ciprofloxacin and enoxacin  certain medicines for blood pressure, heart disease, irregular heart beat  certain medicines for depression, anxiety, or psychotic disturbances  certain medicines for migraine headache like almotriptan, eletriptan, frovatriptan, naratriptan, rizatriptan, sumatriptan, zolmitriptan  certain medicines that treat or prevent blood clots like warfarin,  enoxaparin, and dalteparin  cimetidine  fentanyl  lithium  NSAIDS, medicines for pain and inflammation, like ibuprofen or naproxen  phentermine  procarbazine  rasagiline  sibutramine  St. John's wort  theophylline  tramadol  tryptophan This list may not describe all possible interactions. Give your health care provider a list of all the medicines, herbs, non-prescription drugs, or dietary supplements you use. Also tell them if you smoke, drink alcohol, or use illegal drugs. Some items may interact with your medicine. What should I watch for while using this medicine? Tell your doctor if your symptoms do not get better or if they get worse. Visit your doctor or healthcare provider for regular checks on your progress. Because it may take several weeks to see the full effects of this medicine, it is important to continue your treatment as prescribed by your doctor. This medicine may cause serious skin reactions. They can happen weeks to months after starting the medicine. Contact your healthcare provider right away if you notice fevers or flu-like symptoms with a rash. The rash may be red or purple and then turn into blisters or peeling of the skin. Or, you might notice a red rash with swelling of the face, lips, or lymph nodes in your neck or under your arms. Patients and their families should watch out for new or worsening thoughts of suicide or depression. Also watch out for sudden changes in feelings such as feeling anxious, agitated, panicky, irritable, hostile, aggressive, impulsive, severely restless, overly excited and hyperactive, or not being able to sleep. If this happens, especially at the beginning of treatment or after a change in dose, call your healthcare provider. You may get drowsy or dizzy. Do not drive, use machinery, or do anything that needs mental alertness until you know how this medicine affects you. Do not stand or sit up quickly, especially if you are an older  patient. This reduces the risk of dizzy or fainting spells. Alcohol may interfere with the effect of this medicine. Avoid alcoholic drinks. This medicine can cause an increase in blood pressure. This medicine can also cause a sudden drop in your blood pressure, which may make you feel faint and increase the chance of a fall. These effects are most common when you first start the medicine or when the dose is increased, or during use of other medicines that can cause a sudden drop in blood pressure. Check with your doctor for instructions on monitoring your blood pressure while taking this medicine. Your mouth may get dry. Chewing sugarless gum or sucking hard candy, and drinking plenty of water, may help. Contact your doctor if the problem does not go away or is severe. What side effects  may I notice from receiving this medicine? Side effects that you should report to your doctor or health care professional as soon as possible:  allergic reactions like skin rash, itching or hives, swelling of the face, lips, or tongue  anxious  breathing problems  confusion  changes in vision  chest pain  confusion  elevated mood, decreased need for sleep, racing thoughts, impulsive behavior  eye pain  fast, irregular heartbeat  feeling faint or lightheaded, falls  feeling agitated, angry, or irritable  hallucination, loss of contact with reality  high blood pressure  loss of balance or coordination  palpitations  redness, blistering, peeling or loosening of the skin, including inside the mouth  restlessness, pacing, inability to keep still  seizures  stiff muscles  suicidal thoughts or other mood changes  trouble passing urine or change in the amount of urine  trouble sleeping  unusual bleeding or bruising  unusually weak or tired  vomiting  yellowing of the eyes or skin Side effects that usually do not require medical attention (report to your doctor or health care  professional if they continue or are bothersome):  change in sex drive or performance  change in appetite or weight  constipation  dizziness  dry mouth  headache  increased sweating  nausea  tired This list may not describe all possible side effects. Call your doctor for medical advice about side effects. You may report side effects to FDA at 1-800-FDA-1088. Where should I keep my medicine? Keep out of the reach of children. Store at room temperature between 15 and 30 degrees C (59 to 86 degrees F). Throw away any unused medicine after the expiration date. NOTE: This sheet is a summary. It may not cover all possible information. If you have questions about this medicine, talk to your doctor, pharmacist, or health care provider.  2020 Elsevier/Gold Standard (2018-07-08 13:47:50)

## 2019-05-06 NOTE — Progress Notes (Signed)
   Subjective:    Patient ID: Maria Greene, female    DOB: 1960-12-20, 59 y.o.   MRN: [id number]  CC: 2 month follow up for high blood pressure  HPI  Maria Greene is a new patient to me.  She has a history of uncontrolled HTN, HFpEF, chronic knee pain and presents for follow up.   Maria Greene is having a worsening of her knee pain.  She takes tylenol and motrin and reports that the motrin only makes the pain bearable.  She has been to see orthopedic surgery on 11/12 who stated that she required bilateral knee replacements, but she needed to lose weight first.  She also had steroid injections which did not help.  We discussed a lot of different options including topical therapy (she is using a CBD roll on that is not helping), duloxetine, weight loss and hyaluronic acid injections (her request).   We also discussed her blood pressure.  She has mild headaches sometimes, but otherwise no chest pain.  She recently purchased a blood pressure cuff.  I advised that she take the first BP in the early morning after she wakes up.  She tells me today that she was not sure which dose of spironolactone to be taking so she is only on the 25mg  today.  She took the 50mg  for the one month supply and then went back down to the 25mg .    She is interested in weight loss and we discussed going to a weight management clinic and she is agreeable.    Review of Systems  Constitutional: Negative for activity change, appetite change and fever.  Respiratory: Negative for cough and shortness of breath.   Cardiovascular: Negative for chest pain and leg swelling.  Musculoskeletal: Positive for arthralgias, back pain, gait problem, joint swelling and myalgias.  Neurological: Negative for dizziness and weakness.  Psychiatric/Behavioral: Negative for decreased concentration and dysphoric mood.       Objective:   Physical Exam Vitals and nursing note reviewed.  Constitutional:      General: She is not in acute distress.  Appearance: Normal appearance. She is obese. She is not toxic-appearing.  HENT:     Head: Normocephalic and atraumatic.  Cardiovascular:     Rate and Rhythm: Normal rate and regular rhythm.     Heart sounds: No murmur. No friction rub.  Pulmonary:     Effort: Pulmonary effort is normal. No respiratory distress.     Breath sounds: Normal breath sounds. No wheezing.  Abdominal:     General: Abdomen is flat.     Palpations: Abdomen is soft.  Musculoskeletal:        General: Swelling and tenderness present.     Comments: She has crepitus bilaterally, pain with palpation of joint, habitus makes exam difficult  Neurological:     General: No focal deficit present.     Mental Status: She is alert and oriented to person, place, and time.  Psychiatric:        Mood and Affect: Mood normal.        Behavior: Behavior normal.     HCV, A1C, CBC, iron, ferritin, BMET today  FIT test today, refer for mammogram      Assessment & Plan:  Return in 3-4 months, sooner if needed.

## 2019-05-07 LAB — CBC WITH DIFFERENTIAL/PLATELET
Basophils Absolute: 0.1 10*3/uL (ref 0.0–0.2)
Basos: 1 %
EOS (ABSOLUTE): 0.2 10*3/uL (ref 0.0–0.4)
Eos: 1 %
Hematocrit: 36.6 % (ref 34.0–46.6)
Hemoglobin: 11.8 g/dL (ref 11.1–15.9)
Immature Grans (Abs): 0 10*3/uL (ref 0.0–0.1)
Immature Granulocytes: 0 %
Lymphocytes Absolute: 1.9 10*3/uL (ref 0.7–3.1)
Lymphs: 16 %
MCH: 26.6 pg (ref 26.6–33.0)
MCHC: 32.2 g/dL (ref 31.5–35.7)
MCV: 82 fL (ref 79–97)
Monocytes Absolute: 0.9 10*3/uL (ref 0.1–0.9)
Monocytes: 8 %
Neutrophils Absolute: 9 10*3/uL — ABNORMAL HIGH (ref 1.4–7.0)
Neutrophils: 74 %
Platelets: 394 10*3/uL (ref 150–450)
RBC: 4.44 x10E6/uL (ref 3.77–5.28)
RDW: 14.8 % (ref 11.7–15.4)
WBC: 12.1 10*3/uL — ABNORMAL HIGH (ref 3.4–10.8)

## 2019-05-07 LAB — BMP8+ANION GAP
Anion Gap: 12 mmol/L (ref 10.0–18.0)
BUN/Creatinine Ratio: 15 (ref 9–23)
BUN: 14 mg/dL (ref 6–24)
CO2: 25 mmol/L (ref 20–29)
Calcium: 9.1 mg/dL (ref 8.7–10.2)
Chloride: 102 mmol/L (ref 96–106)
Creatinine, Ser: 0.92 mg/dL (ref 0.57–1.00)
GFR calc Af Amer: 80 mL/min/{1.73_m2} (ref >59)
GFR calc non Af Amer: 70 mL/min/{1.73_m2} (ref >59)
Glucose: 91 mg/dL (ref 65–99)
Potassium: 4.6 mmol/L (ref 3.5–5.2)
Sodium: 139 mmol/L (ref 134–144)

## 2019-05-07 LAB — HEPATITIS C ANTIBODY: Hep C Virus Ab: <0.1 s/co ratio (ref 0.0–0.9)

## 2019-05-07 LAB — FERRITIN: Ferritin: 74 ng/mL (ref 15–150)

## 2019-05-07 LAB — IRON: Iron: 26 ug/dL — ABNORMAL LOW (ref 27–159)

## 2019-05-10 ENCOUNTER — Encounter: Payer: Self-pay | Admitting: Internal Medicine

## 2019-05-10 DIAGNOSIS — Z0000 Encounter for general adult medical examination without abnormal findings: Secondary | ICD-10-CM | POA: Insufficient documentation

## 2019-05-10 DIAGNOSIS — D649 Anemia, unspecified: Secondary | ICD-10-CM | POA: Insufficient documentation

## 2019-05-10 NOTE — Assessment & Plan Note (Signed)
She is 71, has never had colon cancer screening.   Plan Check HCV today (negative) Mammogram ordered today - last 3 years ago, negative PAP smear - total hysterectomy in 1998 for fibroids, no longer has cervix.  Colon cancer - FIT test today, we discussed if this was positive, she would need a colonoscopy.  She has no FH of colon cancer.

## 2019-05-10 NOTE — Assessment & Plan Note (Signed)
We discussed weight loss today.  She is amenable to referral to weight management clinic.  I placed a referral today for medical weight loss clinic in the [facility].    BMI today is 47.  I think with her BP she would qualify for bariatric surgery.

## 2019-05-10 NOTE — Assessment & Plan Note (Signed)
She is doing well currently.  She is on spironolactone.  She has mild HFpEF, no swelling today.    Plan Continue to monitor Continue to work on better BP control.

## 2019-05-10 NOTE — Assessment & Plan Note (Signed)
This was her main complaint today.  She has gone to see Orthopedics who have told her that she needs bilateral knee replacement, but will need to lose weight before undergoing the surgery.   Today we discussed starting duloxetine, voltaren gel.  She is going to call her insurance to see if they will pay for hyaluronic acid or gel injections.  We also referred [facility] to [facility].

## 2019-05-10 NOTE — Assessment & Plan Note (Signed)
Recheck CBC with diff today, add on iron and ferritin

## 2019-05-10 NOTE — Assessment & Plan Note (Signed)
BP continues to be elevated today, even on recheck.  She notes that she takes her medications when she wakes up and that she works third shift, so our appointment was effectively in the evening for her.  She also noted that she only had 1 month of the 50mg  of spironolactone so she has reverted back to the 25mg  dose as of a few weeks ago.  She has not scheduled her sleep study as of yet, but I do think that will be necessary for screening in the future.  Weight loss will be a help and we discussed this today as well.  She is open to any options to help with her knee pain.    Plan Sent in a new Rx for spironolactone 50mg .  Advised that she check her BP after waking up for her evening shift and bring in BP log - Will discuss sleep study at next visit.  Medical nutrition clinic referral today.

## 2019-05-17 ENCOUNTER — Telehealth: Payer: Self-pay | Admitting: Internal Medicine

## 2019-05-17 NOTE — Telephone Encounter (Signed)
Please call patient back about a medication for a yeast infection.

## 2019-05-17 NOTE — Telephone Encounter (Signed)
Pt wants diflucan 150mg  2 tablets, was prescribed originally in July by another provider

## 2019-05-18 ENCOUNTER — Other Ambulatory Visit: Payer: Self-pay

## 2019-05-18 ENCOUNTER — Encounter: Payer: Self-pay | Admitting: Emergency Medicine

## 2019-05-18 ENCOUNTER — Other Ambulatory Visit: Payer: Self-pay | Admitting: Internal Medicine

## 2019-05-18 ENCOUNTER — Ambulatory Visit
Admission: EM | Admit: 2019-05-18 | Discharge: 2019-05-18 | Disposition: A | Discharge status: Home / Self Care | Payer: BLUE CROSS/BLUE SHIELD | Attending: Physician Assistant | Admitting: Physician Assistant

## 2019-05-18 DIAGNOSIS — I10 Essential (primary) hypertension: Secondary | ICD-10-CM | POA: Diagnosis not present

## 2019-05-18 DIAGNOSIS — B3731 Acute candidiasis of vulva and vagina: Secondary | ICD-10-CM

## 2019-05-18 DIAGNOSIS — B373 Candidiasis of vulva and vagina: Secondary | ICD-10-CM

## 2019-05-18 MED ORDER — FLUCONAZOLE 150 MG PO TABS: 150.0000 mg | ORAL_TABLET | Freq: Every day | ORAL | 0 refills | Status: DC

## 2019-05-18 NOTE — ED Notes (Signed)
Patient able to ambulate independently  

## 2019-05-18 NOTE — ED Triage Notes (Addendum)
Pt presents to [facility] for assessment after taking a few antibiotics she had for gum swelling, and then developed vaginal discharge and itching.  Pt states it is common for her to have yeast with antibiotics.  Denies abdominal pain, denies bleeding.  Denies concerns for other STIs

## 2019-05-18 NOTE — Telephone Encounter (Signed)
She may need a telehealth appointment.  I did not hear of any of these symptoms at our in person appointment.  Please schedule with [facility]>

## 2019-05-18 NOTE — Telephone Encounter (Signed)
Best practice is for her to be seen in person in case it is not a yeast infection.  There are also over the counter remedies which she can try.  Thank you!

## 2019-05-18 NOTE — Telephone Encounter (Signed)
Pt is calling back pls contact pt [phone]

## 2019-05-18 NOTE — Telephone Encounter (Signed)
Pt is mad. She says she should have had her medicine sent in immediately because she knows she has a yeast problem. Explained policy on med request 1/26 and 27 She stated she is just going to [facility] where she can get some help and thanks anyway but its not helping her. She did not raise her voice, curse or was she rude just matter of fact. Informed her that would be best that she should go on to urg care.

## 2019-05-18 NOTE — ED Provider Notes (Signed)
[facility]    CSN: [id number] Arrival date & time: 05/18/19  1349      History   Chief Complaint Chief Complaint  Patient presents with   Vaginal Discharge    HPI Maria Greene is a 59 y.o. female.   59 year old female comes in for 3 day history of vaginal itching and clumpy discharge. Patient states this started shortly after taking amoxicillin for gum swelling. Denies urinary symptoms such as frequency, dysuria, hematuria. Denies abdominal pain, nausea, vomiting. Denies fever, chills, flank/back pain. Denies vaginal spotting. S/p hysterectomy. No concerns for STD.      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      Home Medications    Prior to Admission medications   Medication Sig Start Date End Date Taking? Authorizing Provider  acetaminophen (TYLENOL) 500 MG tablet Take 1,000 mg by mouth every 6 (six) hours as needed for mild pain or headache.    [provider]  amLODipine (NORVASC) 10 MG tablet TAKE 1 TABLET(10 MG) BY MOUTH DAILY 05/18/19   Sid Falcon, MD  atorvastatin (LIPITOR) 40 MG tablet Take 1 tablet (40 mg total) by mouth daily. 02/01/19   Maudie Mercury, MD  atorvastatin (LIPITOR) 40 MG tablet Take 1 tablet (40 mg total) by mouth daily. 03/08/19  03/07/20  Maudie Mercury, MD  DULoxetine (CYMBALTA) 30 MG capsule Take 1 capsule (30 mg total) by mouth daily. 05/06/19   Sid Falcon, MD  fluconazole (DIFLUCAN) 150 MG tablet Take 1 tablet (150 mg total) by mouth daily. Take second dose 72 hours later if symptoms still persists. 05/18/19   Tasia Catchings, Magnus Crescenzo V, PA-C  ibuprofen (ADVIL) 400 MG tablet Take 1 tablet (400 mg total) by mouth every 6 (six) hours as needed for moderate pain. 02/01/19   Maudie Mercury, MD  lisinopril (ZESTRIL) 40 MG tablet Take 1 tablet (40 mg total) by mouth daily. 02/15/19   Madalyn Rob, MD  Multiple Vitamins-Minerals (MULTI FOR HER 50+) TABS Take 1 tablet by mouth daily.    [provider]  spironolactone (ALDACTONE) 50 MG tablet Take 1 tablet (50 mg total) by mouth daily. 05/06/19 05/05/20  Sid Falcon, MD    Family History Family History  Problem Relation Age of Onset   Prostate cancer Brother    Cancer - Other Brother        Blood    Social History Social History   Tobacco Use   Smoking status: Never Smoker   Smokeless tobacco: Never Used  Substance Use Topics   Alcohol use: No   Drug use: No     Allergies   Percocet [oxycodone-acetaminophen]   Review of Systems Review of Systems  Reason unable to perform ROS: See HPI as above.  Physical Exam Triage Vital Signs ED Triage Vitals  Enc Vitals Group     BP 05/18/19 1359 (!) 172/119     Pulse Rate 05/18/19 1359 85     Resp 05/18/19 1359 16     Temp 05/18/19 1359 97.6 F (36.4 C)     Temp Source 05/18/19 1359 Temporal     SpO2 05/18/19 1359 96 %     Weight --      Height --      Head Circumference --      Peak Flow --      Pain Score 05/18/19 1400 0     Pain Loc --      Pain Edu? --      Excl. in GC? --    No data found.  Updated Vital Signs BP (!) 172/119 (BP Location: Left Wrist)    Pulse 85    Temp 97.6 F (36.4 C) (Temporal)    Resp 16    SpO2 96%   Physical Exam Constitutional:      General: She is not in  acute distress.    Appearance: Normal appearance. She is well-developed. She is not toxic-appearing or diaphoretic.  HENT:     Head: Normocephalic and atraumatic.  Eyes:     Conjunctiva/sclera: Conjunctivae normal.     Pupils: Pupils are equal, round, and reactive to light.  Pulmonary:     Effort: Pulmonary effort is normal. No respiratory distress.     Comments: Speaking in full sentences without difficulty Musculoskeletal:     Cervical back: Normal range of motion and neck supple.  Skin:    General: Skin is warm and dry.  Neurological:     Mental Status: She is alert and oriented to person, place, and time.    UC Treatments / Results  Labs (all labs ordered are listed, but only abnormal results are displayed) Labs Reviewed - No data to display  EKG   Radiology No results found.  Procedures Procedures (including critical care time)  Medications Ordered in UC Medications - No data to display  Initial Impression / Assessment and Plan / UC Course  I have reviewed the triage vital signs and the nursing notes.  Pertinent labs & imaging results that were available during my care of the patient were reviewed by me and considered in my medical decision making (see chart for details).    Start diflucan to cover for yeast vaginitis. Continue to monitor. Return precautions given. Patient expresses understanding and agrees to plan.  Final Clinical Impressions(s) / UC Diagnoses   Final diagnoses:  Yeast vaginitis   ED Prescriptions    Medication Sig Dispense Auth. Provider   fluconazole (DIFLUCAN) 150 MG tablet Take 1 tablet (150 mg total) by mouth daily. Take second dose 72 hours later if symptoms still persists. 2 tablet Belinda Fisher, PA-C     PDMP not reviewed this encounter.   Belinda Fisher, PA-C 05/18/19 1425

## 2019-05-18 NOTE — Discharge Instructions (Signed)
Start diflucan as directed. Continue to monitor. Monitor for any worsening of symptoms, fever, abdominal pain, nausea, vomiting, to follow up for reevaluation.

## 2019-05-27 ENCOUNTER — Telehealth: Payer: Self-pay | Admitting: Internal Medicine

## 2019-05-27 NOTE — Telephone Encounter (Signed)
Called patient to discuss blood work.   Answered all questions.   Follow up as planned.   Debe Coder, MD

## 2019-05-29 ENCOUNTER — Other Ambulatory Visit: Payer: Self-pay | Admitting: Internal Medicine

## 2019-05-29 DIAGNOSIS — I10 Essential (primary) hypertension: Secondary | ICD-10-CM

## 2019-06-10 ENCOUNTER — Encounter (HOSPITAL_BASED_OUTPATIENT_CLINIC_OR_DEPARTMENT_OTHER): Payer: BLUE CROSS/BLUE SHIELD | Admitting: Internal Medicine

## 2019-06-14 ENCOUNTER — Ambulatory Visit: Payer: BLUE CROSS/BLUE SHIELD

## 2019-06-17 ENCOUNTER — Ambulatory Visit: Payer: BLUE CROSS/BLUE SHIELD

## 2019-06-17 ENCOUNTER — Other Ambulatory Visit: Payer: Self-pay

## 2019-07-15 ENCOUNTER — Ambulatory Visit (INDEPENDENT_AMBULATORY_CARE_PROVIDER_SITE_OTHER): Payer: BLUE CROSS/BLUE SHIELD | Admitting: Internal Medicine

## 2019-07-15 ENCOUNTER — Other Ambulatory Visit: Payer: Self-pay

## 2019-07-15 VITALS — BP 127/81 | HR 92 | Temp 98.1°F | Ht 62.5 in | Wt 294.1 lb

## 2019-07-15 DIAGNOSIS — G8929 Other chronic pain: Secondary | ICD-10-CM

## 2019-07-15 DIAGNOSIS — M79605 Pain in left leg: Secondary | ICD-10-CM | POA: Diagnosis not present

## 2019-07-15 DIAGNOSIS — M25562 Pain in left knee: Secondary | ICD-10-CM | POA: Diagnosis not present

## 2019-07-15 DIAGNOSIS — Z6841 Body Mass Index (BMI) 40.0 and over, adult: Secondary | ICD-10-CM

## 2019-07-15 DIAGNOSIS — I5032 Chronic diastolic (congestive) heart failure: Secondary | ICD-10-CM

## 2019-07-15 DIAGNOSIS — Z791 Long term (current) use of non-steroidal anti-inflammatories (NSAID): Secondary | ICD-10-CM

## 2019-07-15 DIAGNOSIS — M79604 Pain in right leg: Secondary | ICD-10-CM

## 2019-07-15 DIAGNOSIS — Z79899 Other long term (current) drug therapy: Secondary | ICD-10-CM

## 2019-07-15 DIAGNOSIS — M25561 Pain in right knee: Secondary | ICD-10-CM | POA: Diagnosis not present

## 2019-07-15 DIAGNOSIS — Z0000 Encounter for general adult medical examination without abnormal findings: Secondary | ICD-10-CM

## 2019-07-15 MED ORDER — NAPROXEN 375 MG PO TABS: 375.0000 mg | ORAL_TABLET | Freq: Two times a day (BID) | ORAL | 0 refills | Status: DC | PRN

## 2019-07-15 NOTE — Assessment & Plan Note (Addendum)
Patient has history of HFpEF on spironolactone. She has mild pitting edema to mid shin today. Lungs are clear. Weight up 3 lbs in 3 months but up 10 in the past 6 months or so. Will check labs for signs of renal dysfunction which could indicated HF exacerbation and consider adjusting diuretics. - Continue Spironolactone - BMP  ADDENDUM: BMP showed Cr 1.04 (baseline 0.92). This is not a significant elevation. Possibly 2/2 chronic NSAID use. She will be instructed to weigh herself at the same time, wearing the same amount of clothing everyday. She is to contact us if she notices a weight gain of 5lbs in a week or continued significant wight gain as she may need PRN Lasix for this. Patient reached by phone on second attempt - Continue to monitor, has close follow up - Consider PRN Lasix if Vol increases or symptoms deveop

## 2019-07-15 NOTE — Assessment & Plan Note (Signed)
Given her BMI >50 and comorbidities; I have given her information to start attending weight loss seminar as the first step in evaluation for bariatric surgery.  She has been previously referred to weight loss clinic. - Continue to work on weight loss with various resources - Start process of evaluation for bariatric surgery

## 2019-07-15 NOTE — Patient Instructions (Addendum)
Thank you for allowing [place] to care for you  For your leg pain - Continue with Voltaren gel - Take naproxen as needed  For your leg swelling - Will check labs today for signs of heart failure flair  Call [phone] and ask for information on Weight Loss seminar to begin the process of evaluation for bariatric surgery  Disability Parking Form filled out  Follow up with PCP

## 2019-07-15 NOTE — Progress Notes (Signed)
   CC: Heart Failure, Knee Pain, Leg Pain, Morbid Obesity, Preventative Healthcare  HPI:  [name] is a 59 y.o. F with PMHx listed below presenting for Heart Failure, Knee Pain, Leg Pain, Morbid Obesity, Preventative Healthcare. Please see the A&P for the status of the patient's chronic medical problems.  Past Medical History:  Diagnosis Date  . CHF (congestive heart failure) ([facility])   . Dyspnea 03/29/2018  . Hypertension   . Hypertensive urgency 03/29/2018   Review of Systems:  Performed and all others negative.  Physical Exam:  Vitals:   07/15/19 1016  BP: 127/81  Pulse: 92  Temp: 98.1 F (36.7 C)  TempSrc: Oral  SpO2: 98%  Weight: 294 lb 1.6 oz (133.4 kg)  Height: 5' 2.5" (1.588 m)   Physical Exam Constitutional:      General: She is not in acute distress.    Appearance: Normal appearance. She is obese.  Cardiovascular:     Rate and Rhythm: Normal rate and regular rhythm.     Pulses: Normal pulses.     Heart sounds: Normal heart sounds.  Pulmonary:     Effort: Pulmonary effort is normal. No respiratory distress.     Breath sounds: Normal breath sounds.  Abdominal:     General: Bowel sounds are normal. There is no distension.     Palpations: Abdomen is soft.     Tenderness: There is no abdominal tenderness.  Musculoskeletal:     Comments: 1+ pitting edema to mid shin bilaterally. Tenderness of mid right shin Bilateral knee joint line tenderness circumferentially  Skin:    General: Skin is warm and dry.  Neurological:     General: No focal deficit present.     Mental Status: Mental status is at baseline.    Assessment & Plan:   See Encounters Tab for problem based charting.  Patient discussed with Dr. Antony Contras

## 2019-07-15 NOTE — Assessment & Plan Note (Addendum)
Patient continues to have bilateral knee pain. She complains of pain in her right shin and bilateral thighs as well today.  No clear explanation for the thigh pain at this time unless it is referred pain from her lower back. She is not wearing tight fitting clothes/belts.  He right shit pain is located about mid leg on the medial anterior region. The pain is likely from overuse and strain. She is to continue with Voltaren gel and we will switch her motrin to as needed naproxen. She is advised to try to not take the pills everyday. I have also advised she get more supportive footwear as she is wearing slip on rubber sandles today.  She has seen orthopedics as previously mention who stated she would need to loose weight to be considered for surgery. Given her BMI >50 and comorbidities; I have given her information to start attending weight loss seminar as the first step in evaluation for bariatric surgery. - Voltaren as needed - Naproxen BID PRN - Supportive footwear with arch support

## 2019-07-16 LAB — BMP8+ANION GAP
Anion Gap: 13 mmol/L (ref 10.0–18.0)
BUN/Creatinine Ratio: 13 (ref 9–23)
BUN: 13 mg/dL (ref 6–24)
CO2: 24 mmol/L (ref 20–29)
Calcium: 8.9 mg/dL (ref 8.7–10.2)
Chloride: 101 mmol/L (ref 96–106)
Creatinine, Ser: 1.04 mg/dL — ABNORMAL HIGH (ref 0.57–1.00)
GFR calc Af Amer: 69 mL/min/{1.73_m2} (ref >59)
GFR calc non Af Amer: 60 mL/min/{1.73_m2} (ref >59)
Glucose: 108 mg/dL — ABNORMAL HIGH (ref 65–99)
Potassium: 4.7 mmol/L (ref 3.5–5.2)
Sodium: 138 mmol/L (ref 134–144)

## 2019-07-17 LAB — FECAL OCCULT BLOOD, IMMUNOCHEMICAL: Fecal Occult Bld: NEGATIVE

## 2019-07-18 NOTE — Progress Notes (Signed)
Internal Medicine Clinic Attending  Case discussed with Dr. Melvin  at the time of the visit.  We reviewed the resident's history and exam and pertinent patient test results.  I agree with the assessment, diagnosis, and plan of care documented in the resident's note.  

## 2019-07-19 ENCOUNTER — Encounter: Payer: Self-pay | Admitting: Internal Medicine

## 2019-07-27 ENCOUNTER — Ambulatory Visit (INDEPENDENT_AMBULATORY_CARE_PROVIDER_SITE_OTHER): Payer: BLUE CROSS/BLUE SHIELD | Admitting: Internal Medicine

## 2019-07-27 ENCOUNTER — Ambulatory Visit (HOSPITAL_COMMUNITY)
Admission: RE | Admit: 2019-07-27 | Discharge: 2019-07-27 | Disposition: A | Discharge status: Home / Self Care | Payer: BLUE CROSS/BLUE SHIELD | Source: Ambulatory Visit | Attending: Internal Medicine | Admitting: Internal Medicine

## 2019-07-27 ENCOUNTER — Other Ambulatory Visit: Payer: Self-pay

## 2019-07-27 ENCOUNTER — Encounter: Payer: Self-pay | Admitting: Internal Medicine

## 2019-07-27 VITALS — BP 145/96 | HR 86 | Temp 98.2°F | Ht 62.0 in | Wt 295.2 lb

## 2019-07-27 DIAGNOSIS — H9202 Otalgia, left ear: Secondary | ICD-10-CM

## 2019-07-27 DIAGNOSIS — I110 Hypertensive heart disease with heart failure: Secondary | ICD-10-CM

## 2019-07-27 DIAGNOSIS — Z79899 Other long term (current) drug therapy: Secondary | ICD-10-CM

## 2019-07-27 DIAGNOSIS — R1084 Generalized abdominal pain: Secondary | ICD-10-CM

## 2019-07-27 DIAGNOSIS — M25562 Pain in left knee: Secondary | ICD-10-CM | POA: Diagnosis not present

## 2019-07-27 DIAGNOSIS — M25561 Pain in right knee: Secondary | ICD-10-CM

## 2019-07-27 DIAGNOSIS — I10 Essential (primary) hypertension: Secondary | ICD-10-CM

## 2019-07-27 DIAGNOSIS — I8390 Asymptomatic varicose veins of unspecified lower extremity: Secondary | ICD-10-CM

## 2019-07-27 DIAGNOSIS — E669 Obesity, unspecified: Secondary | ICD-10-CM

## 2019-07-27 DIAGNOSIS — Z6841 Body Mass Index (BMI) 40.0 and over, adult: Secondary | ICD-10-CM

## 2019-07-27 DIAGNOSIS — I1A0 Resistant hypertension: Secondary | ICD-10-CM

## 2019-07-27 DIAGNOSIS — R609 Edema, unspecified: Secondary | ICD-10-CM

## 2019-07-27 DIAGNOSIS — G8929 Other chronic pain: Secondary | ICD-10-CM

## 2019-07-27 DIAGNOSIS — I5032 Chronic diastolic (congestive) heart failure: Secondary | ICD-10-CM

## 2019-07-27 MED ORDER — MEDICAL COMPRESSION THIGH HIGH MISC: 0 refills | Status: DC

## 2019-07-27 MED ORDER — ACETIC ACID 2 % OT SOLN: 4.0000 [drp] | OTIC | 0 refills | Status: DC

## 2019-07-27 NOTE — Progress Notes (Signed)
Subjective:    Patient ID: Maria Greene, female    DOB: [birth year], 59 y.o.   MRN: [id number]  CC: Abdominal pain  HPI  Maria Greene is a 59 year old woman with PMH of resistant HTN, Obesity, chronic HFpEF and LE edema who presents for follow up.   Maria Greene reports that she has been having abdominal pain.  She notes the pain started on March 15 after eating steak and mashed potatoes from a steakhouse.  She noted that she had pressure like feeling, lack of BM for about 3 days, burping, gerd.  She was has since been able to pass gas (though it is "toxic" smelling") and have BM which are hard, but normal for her.  She has had straining to pass a bowel movement.  She notes that she regularly has had issues with lactose and tries to avoid anything with lactose in it.  She is now having a BM every 2-3 days.  She notes that this has been a chronic issue for her.  When she was younger, she would only have BM at the beginning and end of her menstrual cycle.  She is not taking anything except Gas-X and ginger-ale.   She further notes intermittent ear pain in the left ear.  She feels like "something is in there deep."  She has some external pain.  She has never had an ear infection or other issues with the ear.    She continues to have pain in her legs and swelling.  She was given compression stockings but they were too small for her.  She is interested in trying a different kind.  She has varicosities which do hurt.  She has been taking her medications including amlodipine, lisinopril and spironolactone for her BP and she has a sleep study scheduled for 08/01/2019.  We spent a while speaking about reasons for her LE edema including venous insufficiency, enlarge (varicose) veins and lymphedema.  She does not have any history of extensive abdominal surgery or pelvic radiation.    Review of Systems  Constitutional: Positive for activity change (due to legs being heavier). Negative for fatigue and fever.    Respiratory: Positive for chest tightness (occasional, goes away with rest. ). Negative for cough, choking and shortness of breath.   Cardiovascular: Positive for leg swelling. Negative for chest pain and palpitations.  Gastrointestinal: Positive for abdominal pain, constipation and nausea. Negative for abdominal distention, blood in stool, diarrhea, rectal pain and vomiting.  Musculoskeletal: Positive for arthralgias and gait problem (due to heavy legs). Negative for back pain.  Skin: Positive for color change (varicose veins).  Neurological: Negative for dizziness, weakness and light-headedness.  Psychiatric/Behavioral: Negative for decreased concentration and dysphoric mood.       Objective:   Physical Exam Vitals and nursing note reviewed.  Constitutional:      General: She is not in acute distress.    Appearance: She is obese. She is not toxic-appearing.  HENT:     Head: Normocephalic and atraumatic.  Cardiovascular:     Rate and Rhythm: Normal rate and regular rhythm.     Heart sounds: No murmur.  Pulmonary:     Effort: Pulmonary effort is normal. No respiratory distress.  Abdominal:     General: A surgical scar is present. Bowel sounds are decreased. There is no distension or abdominal bruit. There are no signs of injury.     Palpations: Abdomen is soft. There is no mass.     Tenderness: There  is no abdominal tenderness. There is no guarding or rebound. Negative signs include Murphy's sign.     Hernia: No hernia is present.  Skin:    General: Skin is warm and dry.     Findings: No erythema.     Comments: She has intermittent varicosities which are tender to palpation.  She has 1+ pitting to mid calf, otherwise no pitting  Neurological:     General: No focal deficit present.     Mental Status: She is alert and oriented to person, place, and time.  Psychiatric:        Mood and Affect: Mood normal.        Behavior: Behavior normal.   HENT: TM normal bilaterally, she has a  tortuous EAC on the left and some yellowish wax.    Abdominal Xray was negative for ileus or SBO.       Assessment & Plan:  Follow up in 2 months

## 2019-07-27 NOTE — Patient Instructions (Signed)
Ms. Leipold - -  For your legs, please get the sleep study to see if this may help with the swelling and your blood pressure.   I have placed an Rx for compression stockings.  You can take this prescription to a medical supply company in town and get fitted for them.   Please keep your sleep study appointment.   For your belly pain, please get an Xray of your abdomen.  Please start daily Miralax (or generic equivalent, PEG).  Goal will be 1 bowel movement every day to every other day.  Also start a food diary.  If you identify foods that trigger issues, try to avoid them.   Thank you!  Come back to see me in 1-2 months for your abdomen.   Take Care

## 2019-07-28 DIAGNOSIS — H9202 Otalgia, left ear: Secondary | ICD-10-CM | POA: Insufficient documentation

## 2019-07-28 DIAGNOSIS — R1084 Generalized abdominal pain: Secondary | ICD-10-CM | POA: Insufficient documentation

## 2019-07-28 NOTE — Assessment & Plan Note (Signed)
Trial of acetic acid drops of otitis externa.  She has no change to her TM at this time.

## 2019-07-28 NOTE — Assessment & Plan Note (Signed)
BP was improved today.  She is planning to get her sleep study this month.  She will continue 3 drug regimen.  Follow up in 2 months with BMET.

## 2019-07-28 NOTE — Assessment & Plan Note (Signed)
Based on her symptoms, DDx includes food intolerance, chronic constipation (idiopathic) or IBS-C.  I think the latter is most likely.    Plan Food diary, examine what might be triggers Miralax daily - goal will be BM every 1-2 days, no straining AXR is normal at this time

## 2019-07-28 NOTE — Assessment & Plan Note (Signed)
Knee pain seems to be related to enlarged legs and LE edema.  Will plan to get her fitted for thigh high compression stockings and see if this helps.  I do not think Lasix would be of much use at this time as she has very little pitting.  She does have varicose veins which should respond to compression.  We spent a long time discussing venous insufficiency and why her legs may be swollen.  She may benefit from evaluation at the lymphedema clinic in the future.   Plan Get sleep study Fitted stockings - thigh high Follow up with orthopedics which was done at last visit.

## 2019-08-01 ENCOUNTER — Other Ambulatory Visit: Payer: Self-pay

## 2019-08-01 ENCOUNTER — Ambulatory Visit (HOSPITAL_BASED_OUTPATIENT_CLINIC_OR_DEPARTMENT_OTHER): Payer: BLUE CROSS/BLUE SHIELD | Attending: Internal Medicine | Admitting: Internal Medicine

## 2019-08-01 VITALS — Ht 62.0 in | Wt 277.0 lb

## 2019-08-01 DIAGNOSIS — I10 Essential (primary) hypertension: Secondary | ICD-10-CM | POA: Diagnosis not present

## 2019-08-01 DIAGNOSIS — G4733 Obstructive sleep apnea (adult) (pediatric): Secondary | ICD-10-CM

## 2019-08-09 ENCOUNTER — Telehealth: Payer: Self-pay | Admitting: Internal Medicine

## 2019-08-09 NOTE — Telephone Encounter (Signed)
Called patient to discuss her abdominal Xray which did not show any concerning findings.  She is taking the miralax and having more bowel movements so her pain is improved.  I reminded her to schedule an appointment in June/July with me.  I will have the front desk call her.   Debe Coder, MD

## 2019-08-13 DIAGNOSIS — G4733 Obstructive sleep apnea (adult) (pediatric): Secondary | ICD-10-CM | POA: Diagnosis not present

## 2019-08-13 NOTE — Procedures (Signed)
   Patient Name: Maria Greene, Maria Greene Date: 08/01/2019 Gender: Female D.O.B: Mar 23, 1961 Age (years): 58 Referring Provider: Anne Shutter Height (inches): 63 Interpreting Physician: Jetty Duhamel MD, ABSM Weight (lbs): 277 RPSGT: Elgin Sink BMI: 49 MRN: [id number] Neck Size: 14.50  CLINICAL INFORMATION Sleep Study Type: HST Indication for sleep study: Hypertension (401.9) Epworth Sleepiness Score: 4  SLEEP STUDY TECHNIQUE A multi-channel overnight portable sleep study was performed. The channels recorded were: nasal airflow, thoracic respiratory movement, and oxygen saturation with a pulse oximetry. Snoring was also monitored.  MEDICATIONS Patient self administered medications include: none reported.  SLEEP ARCHITECTURE Patient was studied for 391.6 minutes. The sleep efficiency was 99.5 % and the patient was supine for 96.1%. The arousal index was 0.6 per hour.  RESPIRATORY PARAMETERS The overall AHI was 10.9 per hour, with a central apnea index of 0.0 per hour. The oxygen nadir was 68% during sleep.  CARDIAC DATA Mean heart rate during sleep was 86.8 bpm.  IMPRESSIONS - Mild obstructive sleep apnea occurred during this study (AHI = 10.9/h). - No significant central sleep apnea occurred during this study (CAI = 0.0/h). - Oxygen desaturation was noted during this study (Min O2 = 68%). Mean sat 92% - Patient snored 11.8% during the sleep.  DIAGNOSIS - Obstructive Sleep Apnea (327.23 [G47.33 ICD-10])  RECOMMENDATIONS - Suggest CPAP titration study or autopap. Other options would be based on clinical judgment. - Be careful with alcohol, sedatives and other CNS depressants that may worsen sleep apnea and disrupt normal sleep architecture. - Sleep hygiene should be reviewed to assess factors that may improve sleep quality. - Weight management and regular exercise should be initiated or continued.  [Electronically signed] 08/13/2019 11:30 AM  Jetty Duhamel MD,  ABSM Diplomate, American Board of Sleep Medicine   NPI: [id number]                          Jetty Duhamel Diplomate, American Board of Sleep Medicine  ELECTRONICALLY SIGNED ON:  08/13/2019, 11:25 AM [facility] PH: [phone]   FX: [phone] ACCREDITED BY THE AMERICAN ACADEMY OF SLEEP MEDICINE

## 2019-08-18 ENCOUNTER — Other Ambulatory Visit: Payer: Self-pay | Admitting: Internal Medicine

## 2019-08-18 DIAGNOSIS — G8929 Other chronic pain: Secondary | ICD-10-CM

## 2019-08-22 NOTE — Telephone Encounter (Signed)
Appt has been made for 10/05/2019.

## 2019-09-15 ENCOUNTER — Telehealth: Payer: Self-pay

## 2019-09-15 NOTE — Telephone Encounter (Signed)
RTC to patient, she states she never received the results from her sleep study.  Per chart, Dr. Criselda Peaches called her on 08/01/19, but there was no answer and VM was not set up.  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   Patient asking if MD can call her early mornings to discuss results. Will forward to PCP. SChaplin, RN,BSN

## 2019-09-15 NOTE — Telephone Encounter (Signed)
Requesting to speak with a nurse about sleep test result, please call pt back.

## 2019-09-15 NOTE — Progress Notes (Signed)
Attempted to call patient to discuss.  No answer.  No voicemail set up.  Debe Coder, MD

## 2019-09-21 ENCOUNTER — Other Ambulatory Visit: Payer: Self-pay | Admitting: Internal Medicine

## 2019-09-21 DIAGNOSIS — I10 Essential (primary) hypertension: Secondary | ICD-10-CM

## 2019-09-23 ENCOUNTER — Other Ambulatory Visit: Payer: Self-pay | Admitting: Internal Medicine

## 2019-09-23 DIAGNOSIS — G8929 Other chronic pain: Secondary | ICD-10-CM

## 2019-09-23 NOTE — Telephone Encounter (Signed)
I have been on inpatient and not able to call in the mornings.  Will attempt to call next week.

## 2019-10-03 NOTE — Telephone Encounter (Signed)
Was able to get in touch with Ms. Eduardo today.   We discussed her sleep study results and plan going forwards.   She is coming in to see me on Wednesday and would like to discuss her CPAP options and left leg pain.   All questions answered.   Debe Coder, MD

## 2019-10-05 ENCOUNTER — Other Ambulatory Visit: Payer: Self-pay

## 2019-10-05 ENCOUNTER — Encounter: Payer: Self-pay | Admitting: Internal Medicine

## 2019-10-05 ENCOUNTER — Ambulatory Visit (INDEPENDENT_AMBULATORY_CARE_PROVIDER_SITE_OTHER): Payer: BLUE CROSS/BLUE SHIELD | Admitting: Internal Medicine

## 2019-10-05 VITALS — BP 160/97 | HR 80 | Temp 99.0°F | Ht 62.0 in | Wt 294.2 lb

## 2019-10-05 DIAGNOSIS — Z6841 Body Mass Index (BMI) 40.0 and over, adult: Secondary | ICD-10-CM

## 2019-10-05 DIAGNOSIS — I5032 Chronic diastolic (congestive) heart failure: Secondary | ICD-10-CM

## 2019-10-05 DIAGNOSIS — M25561 Pain in right knee: Secondary | ICD-10-CM

## 2019-10-05 DIAGNOSIS — M25562 Pain in left knee: Secondary | ICD-10-CM

## 2019-10-05 DIAGNOSIS — G4733 Obstructive sleep apnea (adult) (pediatric): Secondary | ICD-10-CM

## 2019-10-05 DIAGNOSIS — R1084 Generalized abdominal pain: Secondary | ICD-10-CM

## 2019-10-05 DIAGNOSIS — I10 Essential (primary) hypertension: Secondary | ICD-10-CM | POA: Diagnosis not present

## 2019-10-05 DIAGNOSIS — G8929 Other chronic pain: Secondary | ICD-10-CM

## 2019-10-05 DIAGNOSIS — H9202 Otalgia, left ear: Secondary | ICD-10-CM

## 2019-10-05 MED ORDER — SPIRONOLACTONE 50 MG PO TABS: 50.0000 mg | ORAL_TABLET | Freq: Every day | ORAL | 1 refills | Status: DC

## 2019-10-05 NOTE — Progress Notes (Signed)
   Subjective:    Patient ID: Maria Greene, female    DOB: May 21, 1960, 59 y.o.   MRN: [id number]  CC: Follow up of + sleep study and HTN  HPI  Maria Greene is a 59 year old woman with PMH of severe OA of the knees, knee pain, resistant HTN and most recently with a + sleep study.   We discussed her sleep study.  She will need a titration study which will be ordered today.  We discussed good sleep hygeine since she works third shift.   She went to see SM for her knees.  She had hyaluronic acid injections with no improvement.  She has severe OA and likely will need surgery but she needs to lose weight for this.  She is not interested in bariatric surgery, but is interested in medical weight management and possibly saxenda.  I will place a referral today.   Review of Systems  Constitutional: Negative for activity change, appetite change, chills and fever.  Respiratory: Negative for cough and choking.   Cardiovascular: Positive for leg swelling (chronic). Negative for chest pain.  Musculoskeletal: Positive for arthralgias and joint swelling.  Psychiatric/Behavioral: Negative for confusion and decreased concentration.       Objective:   Physical Exam Vitals and nursing note reviewed.  Constitutional:      General: She is not in acute distress.    Appearance: Normal appearance. She is obese. She is not ill-appearing or toxic-appearing.  HENT:     Head: Normocephalic and atraumatic.  Pulmonary:     Effort: Pulmonary effort is normal. No respiratory distress.  Musculoskeletal:        General: Swelling and tenderness present.     Right lower leg: Edema present.     Left lower leg: Edema present.     Comments: + knee crepitus   Neurological:     Mental Status: She is alert.  Psychiatric:        Mood and Affect: Mood normal.        Behavior: Behavior normal.     No labs      Assessment & Plan:  Return in 3 months for HTN follow up.

## 2019-10-05 NOTE — Assessment & Plan Note (Signed)
BP is elevated today.  She hasn't taken any of her medications as of yet.  She works third shift and will not take them until she has eaten.  She notes no chest pain or headaches.    Plan Continue lisinopril, spironolactone, amlodipine Will start treatment for OSA after titration study.

## 2019-10-05 NOTE — Assessment & Plan Note (Signed)
She has some chronic lower extremity swelling which is non pitting, more related to weight.  She has some SOB with walking, more related to knee pain.  Continue to monitor.  Will need treatment for OSA and HTN and weight loss.

## 2019-10-05 NOTE — Assessment & Plan Note (Addendum)
We discussed weight loss today.  She is aware that she will need to lose a significant (70#) amount of weight to get knee surgery which she certainly knees.  She has severe tricompartmental knee OA which is making working hard for her.  If she were to lose weight, she could have knee replacement surgery.  We discussed how better treatment of her OSA and HTN can help with this.  We discussed bariatric surgery.  She is more interested in medical weight management at this time.   Referral to Medical Weight Management here in [place].

## 2019-10-05 NOTE — Patient Instructions (Signed)
Ms. Manka - -  Thank you for coming in to see me!  For the sleep apnea, you will need a titration study.  I have placed a referral for that.   For the knee pain, I think evaluation for knee replacement may be the best option.  Given the need for weight loss, I have placed [facility].   Please come back to see me in about 3 months for your blood pressure.   Thank you!

## 2019-10-11 DIAGNOSIS — G4733 Obstructive sleep apnea (adult) (pediatric): Secondary | ICD-10-CM | POA: Insufficient documentation

## 2019-10-11 NOTE — Assessment & Plan Note (Signed)
This has resolved.

## 2019-10-11 NOTE — Assessment & Plan Note (Signed)
She qualified for OSA based on the sleep study.  We placed a referral today for a titration study to get settings.  I discussed with her at length the CPAP therapy and what it will entail.

## 2019-10-11 NOTE — Assessment & Plan Note (Signed)
We reviewed her recent history, time with Sports Medicine, ineffectiveness of bracing (weight), pain medications, topical therapies, injectable therapies.  She is interested in surgery, but will need to lose weight.  We discussed her ability to qualify for disability and I advised her to discuss with a disability lawyer.   Plan Continue current therapy.  Weight loss management.

## 2019-11-12 ENCOUNTER — Other Ambulatory Visit: Payer: Self-pay | Admitting: Internal Medicine

## 2019-11-12 DIAGNOSIS — I10 Essential (primary) hypertension: Secondary | ICD-10-CM

## 2019-12-05 ENCOUNTER — Other Ambulatory Visit: Payer: Self-pay

## 2019-12-05 ENCOUNTER — Ambulatory Visit (HOSPITAL_COMMUNITY)
Admission: EM | Admit: 2019-12-05 | Discharge: 2019-12-05 | Disposition: A | Discharge status: Home / Self Care | Payer: BLUE CROSS/BLUE SHIELD | Attending: Family Medicine | Admitting: Family Medicine

## 2019-12-05 ENCOUNTER — Encounter (HOSPITAL_COMMUNITY): Payer: Self-pay

## 2019-12-05 DIAGNOSIS — Z1152 Encounter for screening for COVID-19: Secondary | ICD-10-CM

## 2019-12-05 DIAGNOSIS — Z20822 Contact with and (suspected) exposure to covid-19: Secondary | ICD-10-CM | POA: Diagnosis not present

## 2019-12-05 DIAGNOSIS — J069 Acute upper respiratory infection, unspecified: Secondary | ICD-10-CM | POA: Insufficient documentation

## 2019-12-05 DIAGNOSIS — R05 Cough: Secondary | ICD-10-CM

## 2019-12-05 DIAGNOSIS — R0981 Nasal congestion: Secondary | ICD-10-CM | POA: Diagnosis present

## 2019-12-05 LAB — SARS CORONAVIRUS 2 (TAT 6-24 HRS): SARS Coronavirus 2: NEGATIVE

## 2019-12-05 MED ORDER — BENZONATATE 100 MG PO CAPS
100.0000 mg | ORAL_CAPSULE | Freq: Three times a day (TID) | ORAL | 0 refills | Status: DC
Start: 2019-12-05 — End: 2021-06-14

## 2019-12-05 NOTE — ED Provider Notes (Signed)
[facility]   [id number] 12/05/19 Arrival Time: 0950   CC: COVID symptoms  SUBJECTIVE: History from: patient.  Maria Greene is a 59 y.o. female who presents with abrupt onset of nasal congestion, PND, and persistent dry cough for 4 days. Denies sick exposure to COVID, flu or strep. Denies recent travel. Has negative history of Covid. Has not completed Covid vaccines. Has not taken OTC medications for this. There are no aggravating or alleviating factors. Denies previous symptoms in the past. Denies fever, chills, fatigue, sinus pain, rhinorrhea, sore throat, SOB, wheezing, chest pain, nausea, changes in bowel or bladder habits.    ROS: As per HPI.  All other pertinent ROS negative.     Past Medical History:  Diagnosis Date  . CHF (congestive heart failure) (HCC)   . Dyspnea 03/29/2018  . Essential hypertension 04/06/2018  . Hypertension   . Hypertensive urgency 03/29/2018   Past Surgical History:  Procedure Laterality Date  . ABDOMINAL HYSTERECTOMY    . CHOLECYSTECTOMY    . KNEE ARTHROSCOPY    . TUBAL LIGATION     Allergies  Allergen Reactions  . Oxycodone-Acetaminophen Nausea And Vomiting  . Percocet [Oxycodone-Acetaminophen] Nausea And Vomiting   No current facility-administered medications on file prior to encounter.   Current Outpatient Medications on File Prior to Encounter  Medication Sig Dispense Refill  . lisinopril (ZESTRIL) 40 MG tablet TAKE 1 TABLET(40 MG) BY MOUTH DAILY 90 tablet 1  . naproxen (NAPROSYN) 375 MG tablet TAKE 1 TABLET(375 MG) BY MOUTH TWICE DAILY AS NEEDED FOR MODERATE PAIN 60 tablet 0  . acetaminophen (TYLENOL) 500 MG tablet Take 1,000 mg by mouth every 6 (six) hours as needed for mild pain or headache.    Marland Kitchen amLODipine (NORVASC) 10 MG tablet TAKE 1 TABLET(10 MG) BY MOUTH DAILY 90 tablet 1  . atorvastatin (LIPITOR) 40 MG tablet Take 1 tablet (40 mg total) by mouth daily. 30 tablet 0  . diclofenac Sodium (VOLTAREN) 1 % GEL Apply 2 g  topically 4 (four) times daily.    Clinical research associate Bandages & Supports (MEDICAL COMPRESSION THIGH HIGH) MISC 15-20 mmHg 1 each 0  . Multiple Vitamins-Minerals (MULTI FOR HER 50+) TABS Take 1 tablet by mouth daily.    Marland Kitchen spironolactone (ALDACTONE) 50 MG tablet Take 1 tablet (50 mg total) by mouth daily. 90 tablet 1   Social History   Socioeconomic History  . Marital status: Divorced    Spouse name: Not on file  . Number of children: Not on file  . Years of education: Not on file  . Highest education level: Not on file  Occupational History  . Not on file  Tobacco Use  . Smoking status: Never Smoker  . Smokeless tobacco: Never Used  Vaping Use  . Vaping Use: Never used  Substance and Sexual Activity  . Alcohol use: No  . Drug use: No  . Sexual activity: Not on file  Other Topics Concern  . Not on file  Social History Narrative  . Not on file   Social Determinants of Health   Financial Resource Strain:   . Difficulty of Paying Living Expenses:   Food Insecurity:   . Worried About Programme researcher, broadcasting/film/video in the Last Year:   . Barista in the Last Year:   Transportation Needs:   . Freight forwarder (Medical):   Marland Kitchen Lack of Transportation (Non-Medical):   Physical Activity:   . Days of Exercise per Week:   .  Minutes of Exercise per Session:   Stress:   . Feeling of Stress :   Social Connections:   . Frequency of Communication with Friends and Family:   . Frequency of Social Gatherings with Friends and Family:   . Attends Religious Services:   . Active Member of Clubs or Organizations:   . Attends Banker Meetings:   Marland Kitchen Marital Status:   Intimate Partner Violence:   . Fear of Current or Ex-Partner:   . Emotionally Abused:   Marland Kitchen Physically Abused:   . Sexually Abused:    Family History  Problem Relation Age of Onset  . Prostate cancer Brother   . Cancer - Other Brother        Blood    OBJECTIVE:  Vitals:   12/05/19 1116  BP: (!) 157/113  Pulse:  100  Resp: 20  Temp: 98.5 F (36.9 C)  TempSrc: Oral  SpO2: 100%     General appearance: alert; appears fatigued, but nontoxic; speaking in full sentences and tolerating own secretions HEENT: NCAT; Ears: EACs clear, TMs pearly gray; Eyes: PERRL.  EOM grossly intact. Sinuses: nontender; Nose: nares patent without rhinorrhea, Throat: oropharynx clear, tonsils non erythematous or enlarged, uvula midline  Neck: supple without LAD Lungs: unlabored respirations, symmetrical air entry; cough: absent; no respiratory distress; CTAB Heart: regular rate and rhythm.  Radial pulses 2+ symmetrical bilaterally Skin: warm and dry Psychological: alert and cooperative; normal mood and affect  LABS:  No results found for this or any previous visit (from the past 24 hour(s)).   ASSESSMENT & PLAN:  1. Encounter for screening for COVID-19   2. Cough   3. Nasal congestion     COVID testing ordered.  It will take between 1-2 days for test results.  Someone will contact you regarding abnormal results.    Patient should remain in quarantine until they have received Covid results.  If negative you may resume normal activities (go back to work/school) while practicing hand hygiene, social distance, and mask wearing.  If positive, patient should remain in quarantine for 10 days from symptom onset AND greater than 72 hours after symptoms resolution (absence of fever without the use of fever-reducing medication and improvement in respiratory symptoms), whichever is longer Get plenty of rest and push fluids Use OTC zyrtec for nasal congestion, runny nose, and/or sore throat Use OTC flonase for nasal congestion and runny nose Use medications daily for symptom relief Use OTC medications like ibuprofen or tylenol as needed fever or pain Call or go to the ED if you have any new or worsening symptoms such as fever, worsening cough, shortness of breath, chest tightness, chest pain, turning blue, changes in mental  status.  Reviewed expectations re: course of current medical issues. Questions answered. Outlined signs and symptoms indicating need for more acute intervention. Patient verbalized understanding. After Visit Summary given.         Moshe Cipro, NP 12/05/19 1131

## 2019-12-05 NOTE — Discharge Instructions (Signed)
Your COVID test is pending.  You should self quarantine until the test result is back.    Take Tylenol as needed for fever or discomfort.  Rest and keep yourself hydrated.    Go to the emergency department if you develop acute worsening symptoms.     

## 2019-12-05 NOTE — ED Triage Notes (Signed)
Pt presents with cough and nasal congestion x 4 days. Denies fever, body aches.

## 2019-12-21 ENCOUNTER — Other Ambulatory Visit: Payer: Self-pay | Admitting: *Deleted

## 2019-12-21 ENCOUNTER — Other Ambulatory Visit: Payer: Self-pay | Admitting: Internal Medicine

## 2019-12-21 DIAGNOSIS — I10 Essential (primary) hypertension: Secondary | ICD-10-CM

## 2019-12-22 MED ORDER — SPIRONOLACTONE 50 MG PO TABS: 50.0000 mg | ORAL_TABLET | Freq: Every day | ORAL | 1 refills | Status: DC

## 2020-02-14 ENCOUNTER — Encounter: Payer: Self-pay | Admitting: Student

## 2020-02-14 ENCOUNTER — Other Ambulatory Visit: Payer: Self-pay

## 2020-02-14 ENCOUNTER — Ambulatory Visit (HOSPITAL_COMMUNITY)
Admission: RE | Admit: 2020-02-14 | Discharge: 2020-02-14 | Disposition: A | Discharge status: Home / Self Care | Payer: BLUE CROSS/BLUE SHIELD | Source: Ambulatory Visit | Attending: Internal Medicine | Admitting: Internal Medicine

## 2020-02-14 ENCOUNTER — Ambulatory Visit (INDEPENDENT_AMBULATORY_CARE_PROVIDER_SITE_OTHER): Payer: BLUE CROSS/BLUE SHIELD | Admitting: Student

## 2020-02-14 VITALS — BP 162/96 | HR 93 | Temp 98.2°F | Ht 62.0 in | Wt 294.8 lb

## 2020-02-14 DIAGNOSIS — R079 Chest pain, unspecified: Secondary | ICD-10-CM | POA: Diagnosis not present

## 2020-02-14 DIAGNOSIS — G8929 Other chronic pain: Secondary | ICD-10-CM

## 2020-02-14 DIAGNOSIS — R0789 Other chest pain: Secondary | ICD-10-CM | POA: Diagnosis not present

## 2020-02-14 DIAGNOSIS — I5032 Chronic diastolic (congestive) heart failure: Secondary | ICD-10-CM

## 2020-02-14 DIAGNOSIS — G4733 Obstructive sleep apnea (adult) (pediatric): Secondary | ICD-10-CM

## 2020-02-14 DIAGNOSIS — Z6841 Body Mass Index (BMI) 40.0 and over, adult: Secondary | ICD-10-CM | POA: Diagnosis not present

## 2020-02-14 DIAGNOSIS — M25561 Pain in right knee: Secondary | ICD-10-CM

## 2020-02-14 DIAGNOSIS — M25562 Pain in left knee: Secondary | ICD-10-CM

## 2020-02-14 LAB — POCT GLYCOSYLATED HEMOGLOBIN (HGB A1C): Hemoglobin A1C: 5.7 % — AB (ref 4.0–5.6)

## 2020-02-14 LAB — GLUCOSE, CAPILLARY: Glucose-Capillary: 76 mg/dL (ref 70–99)

## 2020-02-14 MED ORDER — SAXENDA 18 MG/3ML ~~LOC~~ SOPN: PEN_INJECTOR | SUBCUTANEOUS | 0 refills | Status: DC

## 2020-02-14 MED ORDER — VICTOZA 18 MG/3ML ~~LOC~~ SOPN: PEN_INJECTOR | SUBCUTANEOUS | 0 refills | Status: DC

## 2020-02-14 MED ORDER — NAPROXEN 375 MG PO TABS: ORAL_TABLET | ORAL | 0 refills | Status: DC

## 2020-02-14 NOTE — Patient Instructions (Addendum)
It was a pleasure seeing you in clinic. Today we discussed:   Knee pain: I will refill your naproxen and we will start you on medication for weight loss   Weight loss: I am starting you on Sexenda daily injections. You will start at 0.6 mg daily and increase by 0.6 mg every week. Please follow up in 4 weeks to check weight and see how you are doing on the medication  Sleep apnea: We will arrange for you have have a CPAP titration so we can start you on CPAP at night.  Chest pain: Your EKG had some abnormalities and with your history of heart failure and high blood pressure you should see cardiology to for further evaluation of you heart. I have placed a referral and you should be called to set that up.   If you have any questions or concerns, please call our clinic at [phone] between 9am-5pm and after hours call [phone] and ask for the internal medicine resident on call. If you feel you are having a medical emergency please call 911.   Thank you, we look forward to helping you remain healthy!

## 2020-02-15 DIAGNOSIS — R0789 Other chest pain: Secondary | ICD-10-CM | POA: Insufficient documentation

## 2020-02-15 NOTE — Progress Notes (Signed)
   CC: Chest pain and follow up of bilateral knee pain  HPI:  [name] is a 59 y.o. F with past medical history documented below presents for follow up of knee pain and chest pain 2 days ago. Please refer to problem based charting for further details and assessment and plan of current problem and chronic medical conditions.   Past Medical History:  Diagnosis Date  . CHF (congestive heart failure) (HCC)   . Dyspnea 03/29/2018  . Essential hypertension 04/06/2018  . Hypertension   . Hypertensive urgency 03/29/2018   Review of Systems:  Negative as per HPI  Physical Exam:  Vitals:   02/14/20 0915  BP: (!) 162/96  Pulse: 93  Temp: 98.2 F (36.8 C)  TempSrc: Oral  SpO2: 99%  Weight: 294 lb 12.8 oz (133.7 kg)  Height: 5\' 2"  (1.575 m)   Physical Exam Constitutional:      Appearance: She is well-developed. She is obese.  HENT:     Head: Normocephalic and atraumatic.  Cardiovascular:     Rate and Rhythm: Normal rate and regular rhythm.     Pulses:          Radial pulses are 2+ on the right side and 2+ on the left side.       Posterior tibial pulses are 2+ on the right side and 2+ on the left side.     Heart sounds: Murmur heard.  No friction rub. No gallop.   Pulmonary:     Effort: Pulmonary effort is normal.     Breath sounds: Normal breath sounds. No decreased breath sounds.  Abdominal:     General: Bowel sounds are normal.     Palpations: Abdomen is soft.  Musculoskeletal:        General: Normal range of motion.     Cervical back: Normal range of motion and neck supple.     Right lower leg: Edema present.     Left lower leg: Edema present.     Comments: Crepitus and pain on ROM of bilateral knees  Skin:    General: Skin is warm and dry.     Capillary Refill: Capillary refill takes less than 2 seconds.  Neurological:     General: No focal deficit present.     Mental Status: She is alert and oriented to person, place, and time.    EKG witNSR with heart rate  of 83, mildly increased ST elevation in V1 and V2 also seen on EKG from 01/31/2020 , [facility]  Assessment & Plan:   See Encounters Tab for problem based charting.  Patient seen with Dr. 04/01/2020

## 2020-02-15 NOTE — Assessment & Plan Note (Addendum)
Patient with and episode of chest pain at rest 2 days ago. States pain is sharp and squeezing pain in the left chest area that waxed and waning over the course of about 5 minutes. She has not had any more episodes of chest pain since then. She does not she has difficulty walking long distances and feels significantly winded when walking up the 4 steps to her house. She denies orthopnea, palpitations, dizziness, syncope, and diaphoresis. She does not recall episodes of chest pain in the past. On exam she is hypertensive to 162/96. Regular rate and Rhythmin on ascultation, no JVD, with 2+ bilateral pitting edema. EKG shows NSR with LVH and ST elevations in V1 and V2 seems slightly more elevated compared to EKG 1 year ago. A1c 5.7 today stable since 1 year ago will continue to monitor. Given her risks factors of hypertension, obesity, and OSA  she should be seen by cardiology for an ischemic workup.  Plan Referral to cardiology.

## 2020-02-15 NOTE — Assessment & Plan Note (Signed)
Still has not had CPAP titration, ordered CPAP titration today so she can have this done at a facility covered by her insurance.

## 2020-02-15 NOTE — Progress Notes (Signed)
Internal Medicine Clinic Attending  I saw and evaluated the patient.  I personally confirmed the key portions of the history and exam documented by Dr. Elaina Pattee and I reviewed pertinent patient test results.  The assessment, diagnosis, and plan were formulated together and I agree with the documentation in the resident's note.   59 year old person with history of HFmrEF in 2019, no prior ischemic evaluation, with a complaint of an atypical non-exertional type chest pain a few nights ago that was self limited. Also has stable DOE with NYHA II and III symptoms. ECG is abnormal, with ST changes in V1 and V2 that a little worse than 2020, and non-specific T wave inversions. She is currently chest pain free, no exertional angina. At risk for ischemic heart diseases complicating her known structural heart disease. Will continue treatment with statin and management of hypertension. Will refer to cardiology for ischemic evaluation to better guide medical management.

## 2020-02-15 NOTE — Assessment & Plan Note (Signed)
Continue to have chronic lower extremity swelling and SOB with walking. Weight is stable since last visit does not appear significantly volume overloaded on exam. Will need continue to work on weight loss, HTN, and OSA.

## 2020-02-15 NOTE — Assessment & Plan Note (Signed)
Patient presenting today for to continue bilateral knee pain and continues to have trouble with loosing weight so that she can get knee surgery due to tricompartmental knee osteoarthitis. States she discussed possibility of starting GLP-1 agonists for weight loss as a friend was on sexenda with and experienced success with weight loss. She has not been able to follow up the weight management clinic. Will continue to work with referral coordinator to get her an appointment. Discussed benefits and risks of GLP-1 agonists and discussed that the slowed gastric emptying may cause nausea and importance of slowly increasing dosage to help prevent this.  Plan Start liraglutide 0.6 mg daily and increase by 0.6 mg weekly until reaching dose of 3 mg daily Follow up in 4 weeks

## 2020-02-23 ENCOUNTER — Telehealth: Payer: Self-pay | Admitting: *Deleted

## 2020-02-23 ENCOUNTER — Telehealth: Payer: Self-pay

## 2020-02-23 NOTE — Telephone Encounter (Signed)
Medication clarification: Received following faxed message from pharmacy:  "Please clarify directions as Victoza is given in increments of 0.6mg  to max of 1.8mg  daily.  Many Thanks"  LOV was 02/14/20 Forwarding to PCP and [facility] team/Dr. Elaina Pattee saw patient last. Penne Lash, RN,BSN

## 2020-02-23 NOTE — Telephone Encounter (Addendum)
Information was faxed to CoverMyMeds for [place] for Saxenda.  Awaiting determination.  Angelina Ok, RN 02/23/2020 4:01 PM.   Fax from [facility]  18 mg/3 ml.was approved 02/23/2020 thru 06/27/2020.  Angelina Ok, RN 02/28/2020 12:06 PM.

## 2020-02-23 NOTE — Telephone Encounter (Signed)
This patient is on liraglutide for weight loss and should be titrated up from 0.6 mg daily to 3 mg daily by increments of 0.6 mg weekly. I believe her insurance prefers the brand Saxenda for the liraglutide should be able to be to be given at appropriate dose.

## 2020-02-24 NOTE — Telephone Encounter (Signed)
TC to [employer].  Pharmacist unavailable.  Message left on pharmacist voicemail informing pharmacist of Dr. Gaylord Shih instructions below.  Reiterated that original RX which was written on 02/14/20 was correct. SChaplin, RN,BSN

## 2020-03-09 ENCOUNTER — Telehealth: Payer: Self-pay

## 2020-03-09 MED ORDER — PEN NEEDLES 32G X 4 MM MISC: 11 refills | Status: DC

## 2020-03-09 NOTE — Telephone Encounter (Signed)
Sent, thank you

## 2020-03-09 NOTE — Telephone Encounter (Signed)
TC from patient, Saxenda prescribed on 10/26 [facility] team, Dr. Elaina Pattee).  Patient states pharmacy needs RX for pen needles as well. Will forward to [facility] team. Thank you, SChaplin, RN,BSN

## 2020-03-13 ENCOUNTER — Encounter: Payer: BLUE CROSS/BLUE SHIELD | Admitting: Internal Medicine

## 2020-03-27 ENCOUNTER — Telehealth: Payer: Self-pay | Admitting: *Deleted

## 2020-03-27 NOTE — Telephone Encounter (Signed)
Pt calls and states she is required to be vaccinated for COVID she is very upset and does not want the vaccine. She wants to know if you would agree with a medical exemption and do the paperwork for the exemption. She states she feels strongly led this way. You may call her at [phone]

## 2020-03-28 NOTE — Telephone Encounter (Signed)
Called / talked to pt - explained scheduling telehealth appt to discuss COVID vaccine per Dr Criselda Peaches; pt agreeable - appt Friday @ 1115 AM.

## 2020-03-28 NOTE — Telephone Encounter (Signed)
I will need to speak with her.  Can you put her on for a telehealth visit on Friday?  I am unlikely to fill out this type of document.   Debe Coder, MD

## 2020-03-30 ENCOUNTER — Ambulatory Visit (INDEPENDENT_AMBULATORY_CARE_PROVIDER_SITE_OTHER): Payer: BLUE CROSS/BLUE SHIELD | Admitting: Internal Medicine

## 2020-03-30 ENCOUNTER — Other Ambulatory Visit: Payer: Self-pay

## 2020-03-30 DIAGNOSIS — Z6841 Body Mass Index (BMI) 40.0 and over, adult: Secondary | ICD-10-CM | POA: Diagnosis not present

## 2020-03-30 DIAGNOSIS — I5032 Chronic diastolic (congestive) heart failure: Secondary | ICD-10-CM

## 2020-03-30 MED ORDER — PEN NEEDLES 32G X 4 MM MISC: 11 refills | Status: DC

## 2020-03-30 MED ORDER — SAXENDA 18 MG/3ML ~~LOC~~ SOPN: PEN_INJECTOR | SUBCUTANEOUS | 0 refills | Status: AC

## 2020-03-30 MED ORDER — FUROSEMIDE 20 MG PO TABS: 20.0000 mg | ORAL_TABLET | Freq: Every day | ORAL | 11 refills | Status: DC

## 2020-03-30 NOTE — Progress Notes (Signed)
  [facility] Internal [facility] Residency Telephone Encounter Continuity Care Appointment  HPI:   This telephone encounter was created for [name] on 03/30/2020 for the following purpose/cc discuss COVID vaccine.  [name] is a 59 year old woman with PMH of obesity, chronic HFpEF, HTN who called to discuss COVID vaccination.  SHe notes that her job is making it required and she is concerned.  She had decided not to get the vaccine, but now feels she must or she will be terminated.  She notes concern that given her medical conditions, she would have a bad reaction to the vaccine.  We discussed the nature of COVID infection itself and discussed possible reactions to the vaccine.  Based on our discussion, she did decide to schedule to get vaccinated.   She noted her cardiologist at [facility] had suggested lasix for her.  I reviewed the note and will prescribe lasix 20mg  daily.   She is ready to get her Saxenda started, will refill for weight loss.    Past Medical History:  Past Medical History:  Diagnosis Date  . CHF (congestive heart failure) (HCC)   . Dyspnea 03/29/2018  . Essential hypertension 04/06/2018  . Hypertension   . Hypertensive urgency 03/29/2018      ROS:   As per HPI, otherwise negative.    Assessment / Plan / Recommendations:   Please see A&P under problem oriented charting for assessment of the patient's acute and chronic medical conditions.   As always, pt is advised that if symptoms worsen or new symptoms arise, they should go to an urgent care facility or to to ER for further evaluation.   Consent and Medical Decision Making:   This is a telephone encounter between Cathi Hazan and Maria Greene on 03/30/2020 for follow up of COVID vaccination. The visit was conducted with the patient located at home and 14/01/2020 at [facility]. The patient's identity was confirmed using their DOB and current address. The patient has consented to being evaluated through a telephone  encounter and understands the associated risks (an examination cannot be done and the patient may need to come in for an appointment) / benefits (allows the patient to remain at home, decreasing exposure to coronavirus). I personally spent 10 minutes on medical discussion.    Plan:  Lasix 20mg  - based on cardiology recommendation Injection weight loss medication - Saxenda.  Need the needles which will be refilled as well.  Follow up in 2-3 months, sooner if needed.

## 2020-06-08 ENCOUNTER — Emergency Department (HOSPITAL_COMMUNITY): Payer: BLUE CROSS/BLUE SHIELD

## 2020-06-08 ENCOUNTER — Telehealth: Payer: Self-pay | Admitting: *Deleted

## 2020-06-08 ENCOUNTER — Encounter (HOSPITAL_COMMUNITY): Payer: Self-pay | Admitting: Emergency Medicine

## 2020-06-08 ENCOUNTER — Other Ambulatory Visit: Payer: Self-pay

## 2020-06-08 ENCOUNTER — Emergency Department (HOSPITAL_COMMUNITY)
Admission: EM | Admit: 2020-06-08 | Discharge: 2020-06-08 | Disposition: A | Discharge status: Home / Self Care | Payer: BLUE CROSS/BLUE SHIELD | Attending: Emergency Medicine | Admitting: Emergency Medicine

## 2020-06-08 DIAGNOSIS — I110 Hypertensive heart disease with heart failure: Secondary | ICD-10-CM | POA: Insufficient documentation

## 2020-06-08 DIAGNOSIS — R0789 Other chest pain: Secondary | ICD-10-CM | POA: Diagnosis not present

## 2020-06-08 DIAGNOSIS — R609 Edema, unspecified: Secondary | ICD-10-CM | POA: Insufficient documentation

## 2020-06-08 DIAGNOSIS — Z79899 Other long term (current) drug therapy: Secondary | ICD-10-CM | POA: Diagnosis not present

## 2020-06-08 DIAGNOSIS — Z7982 Long term (current) use of aspirin: Secondary | ICD-10-CM | POA: Diagnosis not present

## 2020-06-08 DIAGNOSIS — I509 Heart failure, unspecified: Secondary | ICD-10-CM | POA: Diagnosis not present

## 2020-06-08 LAB — BASIC METABOLIC PANEL
Anion gap: 9 (ref 5–15)
BUN: 13 mg/dL (ref 6–20)
CO2: 27 mmol/L (ref 22–32)
Calcium: 8.6 mg/dL — ABNORMAL LOW (ref 8.9–10.3)
Chloride: 101 mmol/L (ref 98–111)
Creatinine, Ser: 1.06 mg/dL — ABNORMAL HIGH (ref 0.44–1.00)
GFR, Estimated: >60 mL/min (ref >60)
Glucose, Bld: 97 mg/dL (ref 70–99)
Potassium: 4.1 mmol/L (ref 3.5–5.1)
Sodium: 137 mmol/L (ref 135–145)

## 2020-06-08 LAB — BRAIN NATRIURETIC PEPTIDE: B Natriuretic Peptide: 13.7 pg/mL (ref 0.0–100.0)

## 2020-06-08 LAB — CBC
HCT: 35.5 % — ABNORMAL LOW (ref 36.0–46.0)
Hemoglobin: 11.2 g/dL — ABNORMAL LOW (ref 12.0–15.0)
MCH: 26.2 pg (ref 26.0–34.0)
MCHC: 31.5 g/dL (ref 30.0–36.0)
MCV: 83.1 fL (ref 80.0–100.0)
Platelets: 378 10*3/uL (ref 150–400)
RBC: 4.27 MIL/uL (ref 3.87–5.11)
RDW: 15.3 % (ref 11.5–15.5)
WBC: 13.4 10*3/uL — ABNORMAL HIGH (ref 4.0–10.5)
nRBC: 0 % (ref 0.0–0.2)

## 2020-06-08 LAB — LIPASE, BLOOD: Lipase: 30 U/L (ref 11–51)

## 2020-06-08 LAB — TROPONIN I (HIGH SENSITIVITY)
Troponin I (High Sensitivity): 13 ng/L (ref <18)
Troponin I (High Sensitivity): 5 ng/L (ref <18)

## 2020-06-08 LAB — HEPATIC FUNCTION PANEL
ALT: 14 U/L (ref 0–44)
AST: 13 U/L — ABNORMAL LOW (ref 15–41)
Albumin: 3.5 g/dL (ref 3.5–5.0)
Alkaline Phosphatase: 82 U/L (ref 38–126)
Bilirubin, Direct: 0.1 mg/dL (ref 0.0–0.2)
Indirect Bilirubin: 0.6 mg/dL (ref 0.3–0.9)
Total Bilirubin: 0.7 mg/dL (ref 0.3–1.2)
Total Protein: 7.9 g/dL (ref 6.5–8.1)

## 2020-06-08 MED ORDER — IOHEXOL 350 MG/ML SOLN
100.0000 mL | Freq: Once | INTRAVENOUS | Status: AC | PRN
  Administered 2020-06-08: 100 mL via INTRAVENOUS

## 2020-06-08 NOTE — ED Notes (Signed)
Reviewed discharge instructions with patient. Follow-up care and medications reviewed. Patient verbalized understanding. Patient A&Ox4, VSS upon discharge. 

## 2020-06-08 NOTE — Telephone Encounter (Signed)
I agree. Patient needs to be evaluated in the ED

## 2020-06-08 NOTE — ED Triage Notes (Signed)
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 this felt like gas pains but they have lasted into today. She states she feels mild relief now but still a dull tightness.

## 2020-06-08 NOTE — ED Provider Notes (Signed)
[facility] EMERGENCY DEPARTMENT Provider Note   CSN: [id number] Arrival date & time: 06/08/20  1126     History Chief Complaint  Patient presents with  . Chest Pain    Maria Greene is a 60 y.o. female with a past medical history of HFpEF, morbid obesity, resistant hypertension, OSA, anemia, who presents today for evaluation of chest pain.  She reports that the pain started last night.  It was worse when it started and has been improving.  She states that her pain feels like a band around her lower chest radiating into her back.  She denies any weakness or numbness.  She did start taking Lasix 3 days ago and reports she has had increased urinary output and occasional cramping in her thighs.  She denies shortness of breath, nausea or vomiting.  She tells me "I think it may just be gas."  She denies any shortness of breath.  No diarrhea.  She denies any aggravating or alleviating factors.  She did try taking some heartburn medicines however does not feel like those helped significantly.  Chart review shows that she was recently seen by [facility] cardiology and it appears they were planning on a stress test which she reports didn't happen.    Chart review shows that in December 2019 she had a CTA PE study which did not show PEs however did show that her aorta was about 4 cm. She states she is unaware of this and did not have follow-up for it. HPI     Past Medical History:  Diagnosis Date  . CHF (congestive heart failure) (HCC)   . Dyspnea 03/29/2018  . Essential hypertension 04/06/2018  . Hypertension   . Hypertensive urgency 03/29/2018    Patient Active Problem List   Diagnosis Date Noted  . Atypical chest pain 02/15/2020  . OSA (obstructive sleep apnea) 10/11/2019  . Preventative health care 05/10/2019  . Normocytic anemia 05/10/2019  . Resistant hypertension 03/08/2019  . Morbid obesity with BMI of 50.0-59.9, adult (HCC) 02/15/2019  . Bilateral chronic knee  pain 02/15/2019  . Chronic heart failure with preserved ejection fraction (HCC) 04/06/2018    Past Surgical History:  Procedure Laterality Date  . ABDOMINAL HYSTERECTOMY    . CHOLECYSTECTOMY    . KNEE ARTHROSCOPY    . TUBAL LIGATION       OB History   No obstetric history on file.     Family History  Problem Relation Age of Onset  . Prostate cancer Brother   . Cancer - Other Brother        Blood    Social History   Tobacco Use  . Smoking status: Never Smoker  . Smokeless tobacco: Never Used  Vaping Use  . Vaping Use: Never used  Substance Use Topics  . Alcohol use: No  . Drug use: No    Home Medications Prior to Admission medications   Medication Sig Start Date End Date Taking? Authorizing Provider  amLODipine (NORVASC) 10 MG tablet TAKE 1 TABLET(10 MG) BY MOUTH DAILY Patient taking differently: Take 10 mg by mouth daily. 11/14/19  Yes Inez Catalina, MD  Aspirin-Acetaminophen-Caffeine (EXCEDRIN MIGRAINE PO) Take 2 tablets by mouth daily as needed (for migraine headache).   Yes [provider]  atorvastatin (LIPITOR) 40 MG tablet Take 1 tablet (40 mg total) by mouth daily. 02/01/19  Yes Dolan Amen, MD  furosemide (LASIX) 20 MG tablet Take 1 tablet (20 mg total) by mouth daily. 03/30/20 03/30/21 Yes  Inez Catalina, MD  ibuprofen (ADVIL) 200 MG tablet Take 800 mg by mouth daily. Takes 4 tablets (800 mg totally) by mouth every day; takes extra 4 tablets (800 mg totally) as needed for pain   Yes [provider]  lisinopril (ZESTRIL) 40 MG tablet TAKE 1 TABLET(40 MG) BY MOUTH DAILY Patient taking differently: Take 40 mg by mouth daily. 09/21/19  Yes Inez Catalina, MD  Multiple Vitamins-Minerals (MULTI FOR HER 50+) TABS Take 1 tablet by mouth daily.   Yes [provider]  spironolactone (ALDACTONE) 50 MG tablet Take 1 tablet (50 mg total) by mouth daily. 12/22/19 12/21/20 Yes Inez Catalina, MD  benzonatate (TESSALON) 100 MG capsule Take 1  capsule (100 mg total) by mouth every 8 (eight) hours. Patient not taking: No sig reported 12/05/19   Moshe Cipro, NP  Elastic Bandages & Supports (MEDICAL COMPRESSION THIGH HIGH) MISC 15-20 mmHg 07/27/19   Inez Catalina, MD  Insulin Pen Needle (PEN NEEDLES) 32G X 4 MM MISC Use with Saxenda as directed 03/30/20   Inez Catalina, MD  naproxen (NAPROSYN) 375 MG tablet TAKE 1 TABLET(375 MG) BY MOUTH TWICE DAILY AS NEEDED FOR MODERATE PAIN Patient not taking: No sig reported 02/14/20   Quincy Simmonds, MD    Allergies    Percocet [oxycodone-acetaminophen]  Review of Systems   Review of Systems  Constitutional: Negative for chills and fever.  HENT: Negative for congestion.   Eyes: Negative for visual disturbance.  Respiratory: Negative for shortness of breath.   Cardiovascular: Positive for chest pain and leg swelling (Chronic, unchanged). Negative for palpitations.  Gastrointestinal: Negative for abdominal pain, diarrhea, nausea and vomiting.  Genitourinary: Negative for dysuria.  Musculoskeletal: Positive for back pain.  Skin: Negative for color change and rash.  Neurological: Negative for weakness and headaches.  Psychiatric/Behavioral: Negative for confusion.  All other systems reviewed and are negative.   Physical Exam Updated Vital Signs BP (!) 154/95 (BP Location: Left Arm)   Pulse 87   Temp 98.1 F (36.7 C) (Oral)   Resp 19   Ht  (1.575 m)   Wt 133.4 kg   SpO2 98%   BMI 53.77 kg/m   Physical Exam Vitals and nursing note reviewed.  Constitutional:      General: She is not in acute distress.    Appearance: She is obese. She is not diaphoretic.  HENT:     Head: Normocephalic and atraumatic.  Eyes:     General: No scleral icterus.       Right eye: No discharge.        Left eye: No discharge.     Conjunctiva/sclera: Conjunctivae normal.  Cardiovascular:     Rate and Rhythm: Normal rate and regular rhythm.     Pulses:          Radial pulses are 2+ on  the right side and 2+ on the left side.       Dorsalis pedis pulses are 2+ on the right side and 2+ on the left side.       Posterior tibial pulses are 2+ on the right side and 2+ on the left side.     Heart sounds: Normal heart sounds. No murmur heard.   Pulmonary:     Effort: Pulmonary effort is normal. No respiratory distress.     Breath sounds: Normal breath sounds. No stridor.  Chest:     Chest wall: No tenderness.  Abdominal:     General: There  is no distension.     Tenderness: There is no abdominal tenderness. There is no guarding.  Musculoskeletal:        General: No deformity.     Cervical back: Normal range of motion.     Right lower leg: No tenderness. Edema present.     Left lower leg: No tenderness. Edema present.  Skin:    General: Skin is warm and dry.  Neurological:     General: No focal deficit present.     Mental Status: She is alert.     Motor: No abnormal muscle tone.  Psychiatric:        Mood and Affect: Mood normal.        Behavior: Behavior normal.     ED Results / Procedures / Treatments   Labs (all labs ordered are listed, but only abnormal results are displayed) Labs Reviewed  BASIC METABOLIC PANEL - Abnormal; Notable for the following components:      Result Value   Creatinine, Ser 1.06 (*)    Calcium 8.6 (*)    All other components within normal limits  CBC - Abnormal; Notable for the following components:   WBC 13.4 (*)    Hemoglobin 11.2 (*)    HCT 35.5 (*)    All other components within normal limits  HEPATIC FUNCTION PANEL - Abnormal; Notable for the following components:   AST 13 (*)    All other components within normal limits  BRAIN NATRIURETIC PEPTIDE  LIPASE, BLOOD  TROPONIN I (HIGH SENSITIVITY)  TROPONIN I (HIGH SENSITIVITY)    EKG EKG Interpretation  Date/Time:  Friday June 08 2020 11:34:17 EST Ventricular Rate:  96 PR Interval:  138 QRS Duration: 86 QT Interval:  346 QTC Calculation: 437 R Axis:   8 Text  Interpretation: Sinus rhythm with occasional Premature ventricular complexes Left ventricular hypertrophy with repolarization abnormality ( R in aVL , Cornell product ) Abnormal ECG No significant change since last tracing Confirmed by Richardean Canal [phone]) on 06/08/2020 5:08:56 PM   Radiology DG Chest 2 View  Result Date: 06/08/2020 CLINICAL DATA:  Chest pain. Additional history provided: Patient reports chest pressure since last night, history of CHF, nonsmoker, hypertension. EXAM: CHEST - 2 VIEW COMPARISON:  Prior chest radiographs 01/30/2019 and earlier. FINDINGS: Heart size at the upper limits of normal. No appreciable airspace consolidation or pulmonary edema. No evidence of pleural effusion or pneumothorax. No acute bony abnormality identified. Thoracic spondylosis and dextrocurvature. Surgical clips within the right upper quadrant of the abdomen. IMPRESSION: No evidence of acute cardiopulmonary abnormality. Heart size at the upper limits of normal. Thoracic spondylosis and dextrocurvature. Electronically Signed   By: Jackey Loge DO   On: 06/08/2020 12:16   CT Angio Chest/Abd/Pel for Dissection W and/or Wo Contrast  Result Date: 06/08/2020 CLINICAL DATA:  Chest pressure. EXAM: CT ANGIOGRAPHY CHEST, ABDOMEN AND PELVIS TECHNIQUE: Non-contrast CT of the chest was initially obtained. Multidetector CT imaging through the chest, abdomen and pelvis was performed using the standard protocol during bolus administration of intravenous contrast. Multiplanar reconstructed images and MIPs were obtained and reviewed to evaluate the vascular anatomy. CONTRAST:  OMNIPAQUE IOHEXOL 350 MG/ML SOLN COMPARISON:  March 29, 2018 FINDINGS: CTA CHEST FINDINGS Cardiovascular: The thoracic aorta is normal in appearance, without evidence of aneurysmal dilatation or dissection. Satisfactory opacification of the pulmonary arteries to the segmental level. No evidence of pulmonary embolism. Normal heart size. No pericardial  effusion. Mediastinum/Nodes: No enlarged mediastinal, hilar, or axillary lymph nodes.  Thyroid gland, trachea, and esophagus demonstrate no significant findings. Lungs/Pleura: Very mild linear scarring and/or atelectasis is seen within the lingular region. There is no evidence of acute infiltrate, pleural effusion or pneumothorax. Musculoskeletal: No chest wall abnormality. No acute or significant osseous findings. Review of the MIP images confirms the above findings. CTA ABDOMEN AND PELVIS FINDINGS VASCULAR Aorta: Normal caliber aorta without aneurysm, dissection, vasculitis or significant stenosis. Celiac: Patent without evidence of aneurysm, dissection, vasculitis or significant stenosis. SMA: Patent without evidence of aneurysm, dissection, vasculitis or significant stenosis. Renals: Both renal arteries are patent without evidence of aneurysm, dissection, vasculitis, fibromuscular dysplasia or significant stenosis. IMA: Patent without evidence of aneurysm, dissection, vasculitis or significant stenosis. Inflow: Patent without evidence of aneurysm, dissection, vasculitis or significant stenosis. Veins: No obvious venous abnormality within the limitations of this arterial phase study. Review of the MIP images confirms the above findings. NON-VASCULAR Hepatobiliary: No focal liver abnormality is seen. Status post cholecystectomy. No biliary dilatation. Pancreas: Unremarkable. No pancreatic ductal dilatation or surrounding inflammatory changes. Spleen: Normal in size without focal abnormality. Adrenals/Urinary Tract: Adrenal glands are unremarkable. Kidneys are normal, without renal calculi, focal lesion, or hydronephrosis. Bladder is unremarkable. Stomach/Bowel: Stomach is within normal limits. Appendix appears normal. No evidence of bowel wall thickening, distention, or inflammatory changes. Lymphatic: No abnormal abdominal or pelvic lymph nodes are identified. Reproductive: Status post hysterectomy. No adnexal  masses. Other: No abdominal wall hernia or abnormality. No abdominopelvic ascites. Musculoskeletal: No acute or significant osseous findings. Review of the MIP images confirms the above findings. IMPRESSION: 1. No evidence of aortic dissection or aneurysm. 2. No evidence of pulmonary embolism. 3. No acute intrathoracic or intra-abdominal pathology. 4. Status post cholecystectomy and hysterectomy. Electronically Signed   By: Aram Candelahaddeus  Houston M.D.   On: 06/08/2020 18:52    Procedures Procedures   Medications Ordered in ED Medications  iohexol (OMNIPAQUE) 350 MG/ML injection 100 mL (100 mLs Intravenous Contrast Given 06/08/20 1835)    ED Course  I have reviewed the triage vital signs and the nursing notes.  Pertinent labs & imaging results that were available during my care of the patient were reviewed by me and considered in my medical decision making (see chart for details).    MDM Rules/Calculators/A&P                         Patient is a 60 year old woman who presents today for evaluation of chest tightness that she describes started last night and feels like a band wrapped around her chest and lower back.  On exam she is afebrile, not tachycardic, hypoxic, or tachypneic.  Lungs are clear bilaterally.  Troponin x2 is negative, CBC shows mild anemia at 11.2 with a white count of 13.4.  Lipase and BNP are both not elevated.  Her blood pressure is slightly elevated while in the emergency room. Chart review shows that she on her last chest CT had a widened aorta and had the recommendation for yearly follow-up which she has not had.  Given this combined with her pain and the nature of her pain into her back she requires dissection rule out.  CT dissection study is ordered and reviewed, no acute abnormalities are cause for patient's symptoms found.  Discussion with patient about all of her results and disposition. Patient will be discharged for outpatient follow-up with her cardiologist and  primary care doctor.  She is also given the information for GI and instructed to start taking PPI.  This patient was seen as a shared visit with Dr. Silverio Lay.    Note: Portions of this report may have been transcribed using voice recognition software. Every effort was made to ensure accuracy; however, inadvertent computerized transcription errors may be present   Final Clinical Impression(s) / ED Diagnoses Final diagnoses:  Atypical chest pain    Rx / DC Orders ED Discharge Orders    None       Norman Clay 06/08/20 2015    Charlynne Pander, MD 06/08/20 2317

## 2020-06-08 NOTE — Discharge Instructions (Addendum)
Please follow-up with your primary care doctor, your cardiologist and GI. While in the ER your blood pressure was high.  Please get this re-checked in the next few weeks.   Please get Prilosec.  This is available over-the-counter.  Please start taking this daily.  If your pain changes, worsens, you feel short of breath, develop fevers, or have any other concerns please seek additional medical care and evaluation.

## 2020-06-08 NOTE — Telephone Encounter (Addendum)
Patient called in requesting appt today. 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 States it is a constant pressure. Rates pain 7/10 at present. Explained we cannot keep patients in clinic with active chest pain for her own safety. Patient instructed to head directly to ED. She is in agreement. Kinnie Feil, BSN, RN-BC

## 2020-06-11 ENCOUNTER — Telehealth: Payer: Self-pay

## 2020-06-11 NOTE — Telephone Encounter (Signed)
Transition Care Management Unsuccessful Follow-up Telephone Call  Date of discharge and from where:  Maria Greene 06/08/2020  Attempts:  1st Attempt  Reason for unsuccessful TCM follow-up call:  Unable to leave message

## 2020-06-12 NOTE — Telephone Encounter (Signed)
Transition Care Management Follow-up Telephone Call  Date of discharge and from where: 06/08/2020 from [facility]  How have you been since you were released from the hospital? Pt states that she is feeling much better, there is still some lingering chest pain but she has only been taking Prilosec for a few days.   Any questions or concerns? No  Items Reviewed:  Did the pt receive and understand the discharge instructions provided? Yes   Medications obtained and verified? Yes   Other? No   Any new allergies since your discharge? No   Dietary orders reviewed? N/A  Do you have support at home? Yes   Functional Questionnaire: (I = Independent and D = Dependent) ADLs: I  Bathing/Dressing- I  Meal Prep- I  Eating- I  Maintaining continence- I  Transferring/Ambulation- I  Managing Meds- I   Follow up appointments reviewed:   Specialist Hospital f/u appt confirmed? No  Pt will call Gastro office for an appointment.   Are transportation arrangements needed? No  If their condition worsens, is the pt aware to call PCP or go to the Emergency Dept.? Yes Was the patient provided with contact information for the PCP's office or ED? Yes Was to pt encouraged to call back with questions or concerns? Yes

## 2020-07-03 ENCOUNTER — Other Ambulatory Visit: Payer: Self-pay | Admitting: Internal Medicine

## 2020-07-03 DIAGNOSIS — I10 Essential (primary) hypertension: Secondary | ICD-10-CM

## 2020-10-23 ENCOUNTER — Encounter: Payer: Self-pay | Admitting: *Deleted

## 2020-10-31 ENCOUNTER — Other Ambulatory Visit: Payer: Self-pay | Admitting: Internal Medicine

## 2021-04-23 ENCOUNTER — Other Ambulatory Visit: Payer: Self-pay | Admitting: Internal Medicine

## 2021-04-23 DIAGNOSIS — I10 Essential (primary) hypertension: Secondary | ICD-10-CM

## 2021-04-24 ENCOUNTER — Encounter: Payer: Self-pay | Admitting: *Deleted

## 2021-04-24 NOTE — Telephone Encounter (Signed)
Last OV was 03/30/20. Called pt to schedule an appt , no answer and mailbox is full. I will send pt a MY Chart message.

## 2021-04-25 NOTE — Telephone Encounter (Signed)
Next appt scheduled 06/14/21 with PCP. °

## 2021-06-14 ENCOUNTER — Encounter: Payer: Self-pay | Admitting: Internal Medicine

## 2021-06-14 ENCOUNTER — Other Ambulatory Visit: Payer: Self-pay

## 2021-06-14 ENCOUNTER — Ambulatory Visit: Payer: Managed Care, Other (non HMO) | Admitting: Internal Medicine

## 2021-06-14 VITALS — BP 162/103 | HR 86 | Temp 98.4°F | Ht 62.0 in | Wt 296.6 lb

## 2021-06-14 DIAGNOSIS — G8929 Other chronic pain: Secondary | ICD-10-CM

## 2021-06-14 DIAGNOSIS — Z1231 Encounter for screening mammogram for malignant neoplasm of breast: Secondary | ICD-10-CM

## 2021-06-14 DIAGNOSIS — Z6841 Body Mass Index (BMI) 40.0 and over, adult: Secondary | ICD-10-CM

## 2021-06-14 DIAGNOSIS — I5032 Chronic diastolic (congestive) heart failure: Secondary | ICD-10-CM

## 2021-06-14 DIAGNOSIS — M25562 Pain in left knee: Secondary | ICD-10-CM

## 2021-06-14 DIAGNOSIS — I110 Hypertensive heart disease with heart failure: Secondary | ICD-10-CM

## 2021-06-14 DIAGNOSIS — Z0000 Encounter for general adult medical examination without abnormal findings: Secondary | ICD-10-CM

## 2021-06-14 DIAGNOSIS — D649 Anemia, unspecified: Secondary | ICD-10-CM | POA: Diagnosis not present

## 2021-06-14 DIAGNOSIS — M25561 Pain in right knee: Secondary | ICD-10-CM | POA: Diagnosis not present

## 2021-06-14 DIAGNOSIS — I10 Essential (primary) hypertension: Secondary | ICD-10-CM

## 2021-06-14 MED ORDER — HYDROCHLOROTHIAZIDE 25 MG PO TABS: 25.0000 mg | ORAL_TABLET | Freq: Every day | ORAL | 3 refills | Status: DC

## 2021-06-14 MED ORDER — SPIRONOLACTONE 50 MG PO TABS: ORAL_TABLET | ORAL | 1 refills | Status: DC

## 2021-06-14 MED ORDER — FUROSEMIDE 20 MG PO TABS: 20.0000 mg | ORAL_TABLET | Freq: Every day | ORAL | 3 refills | Status: DC

## 2021-06-14 MED ORDER — LISINOPRIL 40 MG PO TABS: 40.0000 mg | ORAL_TABLET | Freq: Every day | ORAL | 1 refills | Status: DC

## 2021-06-14 NOTE — Assessment & Plan Note (Signed)
Check CBC today, if abnormal, will add on iron studies

## 2021-06-14 NOTE — Assessment & Plan Note (Signed)
Referred to orthopedic surgery.  She has severe tricompartmental disease.  She has tried gel injections without relief.  Will have her meet and discuss options with surgery.

## 2021-06-14 NOTE — Patient Instructions (Addendum)
[name] - -  For your knee pain, I have sent a referral to the orthopedic surgeon for you to get an opinion about surgical options.   For your swelling, please start taking your lasix about 1-2 hours before you finish your shift and this may help with needing to urinate through your sleeping hours.  Also, we will stop your amlodipine.   For your blood pressure: Please STOP amlodopinine Please continue lisinopril Please START HCTZ Please START spironolactone  Come back in 3 weeks for a blood pressure check!  I have also ordered a mammogram for you to get caught up on screening for breast cancer and a stool blood test for screening for colon cancer.  We will try to do a PAP smear at your next visit in 3 weeks.

## 2021-06-14 NOTE — Assessment & Plan Note (Signed)
She is interested in starting Ozempic for weight loss.  Will discuss further at next visit.  She is not sure how much it will be with her insurance, so I will send in an Rx now and she will assess if she can afford it.

## 2021-06-14 NOTE — Assessment & Plan Note (Signed)
Scheduled Mammogram today FIT test today PAP smear at follow up visit.

## 2021-06-14 NOTE — Progress Notes (Signed)
Subjective:    Patient ID: Maria Greene, female    DOB: 1960-11-24, 61 y.o.   MRN: [id number]  CC: overdue follow up for HTN, chronic knee pain  HPI  Ms. Struble is a 61 year old woman with PMH of HFpEF, chronic knee pain, HTN, anemia.  She was recently seen for atypical chest pain thought to be from a GI source. This is improved.  She is also overdue for screening tests.  She currently works the night shift and we discussed ways to help with sleeping during the day and shifting her medications.    She notes increased swelling in the legs.  She has been wearing compression stockings during work, but this has not helped much.  She takes lasix, but she is concerned because it makes her urinate while she is trying to sleep.  She takes it in the morning after working.  We discussed possibly moving this to earlier before she leaves work.  The swelling makes her feet hurt and she also has chronic knee pain.  She has tried gel shots without much success.  She has had xrays which show severe tricompartmental OA throughout, bone on bone (2020).  She is interested in discussing options with a Psychologist, sport and exercise.    She has been out or intermittently out of her BP medications for a while now.  She recently bought lisinopril and amlodipine and has been taking these.   She notes odd "pricking" sensation that comes and goes on arms/legs.  She is not sure what is causing them.    Finally, dry skin on face and legs, using cerave, wondering if her medications can cause.   Review of Systems  Constitutional:  Negative for activity change, appetite change and fatigue.  Respiratory:  Negative for cough, chest tightness and shortness of breath.   Cardiovascular:  Positive for leg swelling. Negative for chest pain and palpitations.  Gastrointestinal:  Negative for abdominal distention, abdominal pain and blood in stool.  Endocrine: Positive for polyuria. Negative for polydipsia.  Genitourinary:  Negative for difficulty  urinating, dysuria, vaginal bleeding and vaginal discharge.  Musculoskeletal:  Positive for arthralgias and joint swelling (knees).  Skin:  Positive for color change (on skin below knees).  Neurological:  Negative for dizziness and weakness.  Psychiatric/Behavioral:  Negative for confusion and decreased concentration.       Objective:   Physical Exam Constitutional:      General: She is not in acute distress.    Appearance: She is obese. She is not toxic-appearing.  HENT:     Head: Normocephalic and atraumatic.  Eyes:     General: No scleral icterus.    Conjunctiva/sclera: Conjunctivae normal.  Cardiovascular:     Rate and Rhythm: Normal rate and regular rhythm.     Heart sounds: No murmur heard. Pulmonary:     Effort: Pulmonary effort is normal. No respiratory distress.     Breath sounds: Normal breath sounds.  Abdominal:     General: Abdomen is flat. Bowel sounds are normal. There is no distension.     Palpations: There is no mass.  Musculoskeletal:        General: Swelling and tenderness present. No deformity.     Right lower leg: Edema present.     Left lower leg: Edema present.  Skin:    General: Skin is warm and dry.     Findings: Rash (itchy rash on anterior shins under knees bilateral, flat, macular, hyperpigmented) present.  Neurological:  Mental Status: She is alert and oriented to person, place, and time.     Motor: No weakness.  Psychiatric:        Mood and Affect: Mood normal.        Behavior: Behavior normal.    CMET, CBC, Lipid panel today  Orthopedic referral.       Assessment & Plan:  Return in 3 weeks for blood pressure check and PAP smear.

## 2021-06-14 NOTE — Assessment & Plan Note (Addendum)
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  She is likely suffering from this due to uncontrolled HTN and possible coronary disease.  Plan for HTN treatment as above.  Will plan to repeat TTE as well.  If maintains HFmrEF will need to consider beta blocker and other GDMT.  Restarting lasix today as well.

## 2021-06-14 NOTE — Assessment & Plan Note (Signed)
Maria Greene has been intermittently out of her medications.  She went without insurance and was not taking for a few months.  About 1 month ago, she started back taking lisinopril and amlodipine only.  Her BP is not well controlled.  She also has chronic non pitting edema in the legs.  Given these findings, will do the following  Stop amlodipine to see if this helps with swelling Start HCTZ 25mg  Continue Lisinopril Start spironolactone 50mg  Start back on lasix 20mg  for swelling, advised about when to take so that she does not have increased urination when she is trying to sleep  Bmet today Return in 3 weeks for BP check and repeat bmet.

## 2021-06-26 ENCOUNTER — Other Ambulatory Visit: Payer: Managed Care, Other (non HMO)

## 2021-06-26 DIAGNOSIS — D649 Anemia, unspecified: Secondary | ICD-10-CM

## 2021-06-27 LAB — FECAL OCCULT BLOOD, IMMUNOCHEMICAL: Fecal Occult Bld: NEGATIVE

## 2021-07-05 ENCOUNTER — Encounter: Payer: Managed Care, Other (non HMO) | Admitting: Student

## 2021-07-09 ENCOUNTER — Other Ambulatory Visit: Payer: Self-pay | Admitting: Internal Medicine

## 2021-07-09 DIAGNOSIS — I10 Essential (primary) hypertension: Secondary | ICD-10-CM

## 2021-07-12 ENCOUNTER — Ambulatory Visit: Payer: Managed Care, Other (non HMO) | Admitting: Student

## 2021-07-12 ENCOUNTER — Encounter: Payer: Self-pay | Admitting: Student

## 2021-07-12 VITALS — BP 152/94 | HR 95 | Temp 98.3°F | Ht 62.0 in | Wt 294.5 lb

## 2021-07-12 DIAGNOSIS — G4733 Obstructive sleep apnea (adult) (pediatric): Secondary | ICD-10-CM

## 2021-07-12 DIAGNOSIS — I10 Essential (primary) hypertension: Secondary | ICD-10-CM | POA: Diagnosis not present

## 2021-07-12 NOTE — Patient Instructions (Signed)
[name], it was a pleasure seeing you today! ? ?Today we discussed: ?- We are not going to make medication changes at this point. I don't think you will get much benefit from increasing the dose. ? ?- The two most important things will be to get the CPAP machine and get off the NSAID's. Hopefully after your visit with the orthopedic next week you will be able to.  ? ?I have ordered the following labs today: ? ?Lab Orders    ?     BMP8+Anion Gap    ?  ?Follow-up:  1 month   ? ?Please make sure to arrive 15 minutes prior to your next appointment. If you arrive late, you may be asked to reschedule.  ? ?We look forward to seeing you next time. Please call our clinic at [phone] if you have any questions or concerns. The best time to call is Monday-Friday from 9am-4pm, but there is someone available 24/7. If after hours or the weekend, call the main hospital number and ask for the Internal Medicine Resident On-Call. If you need medication refills, please notify your pharmacy one week in advance and they will send [place] a request. ? ?Thank you for letting us take part in your care. Wishing you the best! ? ?Thank you, ?Evlyn Kanner, MD ? ?

## 2021-07-14 LAB — BMP8+ANION GAP
Anion Gap: 18 mmol/L (ref 10.0–18.0)
BUN/Creatinine Ratio: 16 (ref 9–23)
BUN: 20 mg/dL (ref 6–24)
CO2: 24 mmol/L (ref 20–29)
Calcium: 9.3 mg/dL (ref 8.7–10.2)
Chloride: 97 mmol/L (ref 96–106)
Creatinine, Ser: 1.25 mg/dL — ABNORMAL HIGH (ref 0.57–1.00)
Glucose: 101 mg/dL — ABNORMAL HIGH (ref 70–99)
Potassium: 4.2 mmol/L (ref 3.5–5.2)
Sodium: 139 mmol/L (ref 134–144)
eGFR: 50 mL/min/{1.73_m2} — ABNORMAL LOW (ref >59)

## 2021-07-15 NOTE — Progress Notes (Signed)
? ?  CC: hypertension follow-up ? ?HPI: ? ?[name] is a 61 y.o. person with medical history as below presenting to [facility] for hypertension follow-up. ? ?Please see problem-based list for further details, assessments, and plans. ? ?Past Medical History:  ?Diagnosis Date  ? CHF (congestive heart failure) ([place])   ? Dyspnea 03/29/2018  ? Essential hypertension 04/06/2018  ? Hypertension   ? Hypertensive urgency 03/29/2018  ? ?Review of Systems:  As per HPI ? ?Physical Exam: ? ?Vitals:  ? 07/12/21 1051  ?BP: (!) 152/94  ?Pulse: 95  ?Temp: 98.3 ?F (36.8 ?C)  ?TempSrc: Oral  ?SpO2: 96%  ?Weight: 294 lb 8 oz (133.6 kg)  ?Height: 5\' 2"  (1.575 m)  ? ?General: Resting comfortably in no acute distress ?CV: Regular rate, rhythm. No murmurs appreciated. ?Pulm: Normal respiratory effort on room air. Clear to ausculation bilaterally. ?MSK: Normal bulk, tone. No pitting edema bilateral lower extremities.  ?Skin: Warm, dry. No rashes or lesions appreciated. ?Neuro: Awake, alert, conversing appropriately. ?Psych: Normal mood, affect, speech. ? ?Assessment & Plan:  ? ?Resistant hypertension ?Mr. Shaub is presenting today for hypertension follow-up. Since those medication changes, she reports her leg swelling has improved. She has been compliant with these medications and have not had any issues with her current regimen. Denies chest pain, dyspnea, lightheadedness, dizziness, syncopal episodes.  ? ?Blood pressure is still not controlled today, 152/94. However, this is improved from her previous. She does have OSA without CPAP, but given new insurance we will order one today. In addition, she takes daily NSAID's for her knee pain. She is going to see orthopedics next week to discuss possible surgery. Given little benefit in increase in dosages, we will continue with her current regimen. Will continue to avoid amlodipine with improvement of leg swelling. Expect improvement of BP when she weans off daily NSAID's and obtain CPAP.  ? ?-  Hydrochlorothiazide 25mg  daily ?- Lisinopril 40mg  daily ?- Spironolactone 50mg  daily ?- Furosemide 20mg  daily ?- Repeat BMET ? ?OSA (obstructive sleep apnea) ?CPAP ordered today ? ? ?Patient discussed with Dr. Dareen Piano ? ?Sanjuan Dame, MD ?Internal Medicine PGY-2 ?Pager: [phone] ? ?

## 2021-07-15 NOTE — Assessment & Plan Note (Signed)
CPAP ordered today ?

## 2021-07-15 NOTE — Assessment & Plan Note (Addendum)
Mr. Sample is presenting today for hypertension follow-up. Since those medication changes, she reports her leg swelling has improved. She has been compliant with these medications and have not had any issues with her current regimen. Denies chest pain, dyspnea, lightheadedness, dizziness, syncopal episodes.  ? ?Blood pressure is still not controlled today, 152/94. However, this is improved from her previous. She does have OSA without CPAP, but given new insurance we will order one today. In addition, she takes daily NSAID's for her knee pain. She is going to see orthopedics next week to discuss possible surgery. Given little benefit in increase in dosages, we will continue with her current regimen. Will continue to avoid amlodipine with improvement of leg swelling. Expect improvement of BP when she weans off daily NSAID's and obtain CPAP.  ? ?- Hydrochlorothiazide 25mg  daily ?- Lisinopril 40mg  daily ?- Spironolactone 50mg  daily ?- Furosemide 20mg  daily ?- Repeat BMET ? ?ADDENDUM: ?sCr slightly elevated to 1.25 from one year ago. I have instructed Ms. Facundo to make sure she is staying hydrated with water and we will repeat lab at next visit in one month. ? ?- Repeat BMP in one month ?

## 2021-07-16 NOTE — Progress Notes (Signed)
Internal Medicine Clinic Attending ? ?Case discussed with Dr. Braswell  At the time of the visit.  We reviewed the resident?s history and exam and pertinent patient test results.  I agree with the assessment, diagnosis, and plan of care documented in the resident?s note.  ?

## 2021-07-17 ENCOUNTER — Ambulatory Visit: Payer: Managed Care, Other (non HMO) | Admitting: Surgical

## 2021-07-17 ENCOUNTER — Ambulatory Visit (INDEPENDENT_AMBULATORY_CARE_PROVIDER_SITE_OTHER): Payer: Managed Care, Other (non HMO)

## 2021-07-17 ENCOUNTER — Other Ambulatory Visit: Payer: Self-pay

## 2021-07-17 ENCOUNTER — Encounter: Payer: Self-pay | Admitting: Orthopedic Surgery

## 2021-07-17 VITALS — Ht 62.0 in | Wt 294.0 lb

## 2021-07-17 DIAGNOSIS — M1711 Unilateral primary osteoarthritis, right knee: Secondary | ICD-10-CM | POA: Diagnosis not present

## 2021-07-17 DIAGNOSIS — M25561 Pain in right knee: Secondary | ICD-10-CM | POA: Diagnosis not present

## 2021-07-17 DIAGNOSIS — M1712 Unilateral primary osteoarthritis, left knee: Secondary | ICD-10-CM

## 2021-07-17 DIAGNOSIS — M25562 Pain in left knee: Secondary | ICD-10-CM | POA: Diagnosis not present

## 2021-07-17 MED ORDER — BUPIVACAINE HCL 0.25 % IJ SOLN
4.0000 mL | INTRAMUSCULAR | Status: AC | PRN
  Administered 2021-07-17: 4 mL via INTRA_ARTICULAR

## 2021-07-17 MED ORDER — METHYLPREDNISOLONE ACETATE 40 MG/ML IJ SUSP
40.0000 mg | INTRAMUSCULAR | Status: AC | PRN
  Administered 2021-07-17: 40 mg via INTRA_ARTICULAR

## 2021-07-17 MED ORDER — LIDOCAINE HCL 1 % IJ SOLN
5.0000 mL | INTRAMUSCULAR | Status: AC | PRN
  Administered 2021-07-17: 5 mL

## 2021-07-17 MED ORDER — CELECOXIB 100 MG PO CAPS: 100.0000 mg | ORAL_CAPSULE | Freq: Two times a day (BID) | ORAL | 0 refills | Status: DC

## 2021-07-17 NOTE — Progress Notes (Signed)
? ?Office Visit Note ?  ?Patient: Maria Greene           ?Date of Birth: 24-Feb-1961           ?MRN: [id number] ?Visit Date: 07/17/2021 ?Requested by: Inez Catalina, MD ?[address] ?[place],  [place] [address] ?PCP: Inez Catalina, MD ? ?Subjective: ?Chief Complaint  ?Patient presents with  ? Right Knee - Pain  ? Left Knee - Pain  ? ? ?HPI: Maria Greene is a 61 y.o. female who presents to the office complaining of bilateral knee pain.  Patient has history of bilateral knee osteoarthritis.  She complains of constant pain that is diffusely around both knees and radiates down both shins to her ankle and feet.  She denies any back pain or groin pain.  Right knee bothers her more than the left.  She has 2-minute walking endurance due to the severe knee pain she has.  Pain wakes her up when she tries to sleep.  She has history of prior right knee arthroscopy about 15 years ago that helped for a time.  No history of surgery on the left knee.  She has had prior injections with a gel injection 1 year ago that provided no relief at all.  She is also had cortisone injections years ago that provided somewhat decent relief for her.  She has stiffness with immobility.  Her primary care provider is Dr. Criselda Peaches.  She does not have a cardiologist.  She does not take any blood thinners and denies any history of diabetes or smoking.  Most recent BMI is 53..   ?             ?ROS: All systems reviewed are negative as they relate to the chief complaint within the history of present illness.  Patient denies fevers or chills. ? ?Assessment & Plan: ?Visit Diagnoses:  ?1. Unilateral primary osteoarthritis, left knee   ?2. Unilateral primary osteoarthritis, right knee   ? ? ?Plan: Patient is a 61 year old female who presents for evaluation of bilateral knee pain.  She has severe bilateral knee osteoarthritis noted on radiographs today with severe degenerative changes in all 3 compartments but worst in the medial compartment of both knees.  She  has varus alignment.  Only 2-minute walking endurance by her history.  Prior right knee arthroscopy but no prior left knee surgery.  Discussed options available to patient.  She would like to try cortisone injections today with her BMI today that is untenable for knee replacement.  Discussed with her today the reasoning behind BMI restriction and the possible complications of knee replacement surgery.  She still would like to proceed with knee replacement in the future to get some relief of her knee pain but she will work with her PCP on losing weight.  Goal weight for her would be about 215 pounds which would put her at a BMI around 40.  She tolerated the injections well today.  Follow-up with the office as needed and the soonest injections we could repeat in these knees would be in about 4 months.  Do not think that gel injections would be helpful for her as she has had no relief from these in the past. ? ?Follow-Up Instructions: No follow-ups on file.  ? ?Orders:  ?Orders Placed This Encounter  ?Procedures  ? XR KNEE 3 VIEW RIGHT  ? XR Knee 1-2 Views Left  ? ?Meds ordered this encounter  ?Medications  ? celecoxib (CELEBREX) 100 MG capsule  ?  Sig: Take 1 capsule (100 mg total) by mouth 2 (two) times daily. Do not take with Advil  ?  Dispense:  60 capsule  ?  Refill:  0  ? ? ? ? Procedures: ?Large Joint Inj: bilateral knee on 07/17/2021 12:23 PM ?Indications: diagnostic evaluation, joint swelling and pain ?Details: 18 G 1.5 in needle, superolateral approach ? ?Arthrogram: No ? ?Medications (Right): 5 mL lidocaine 1 %; 4 mL bupivacaine 0.25 %; 40 mg methylPREDNISolone acetate 40 MG/ML ?Medications (Left): 5 mL lidocaine 1 %; 4 mL bupivacaine 0.25 %; 40 mg methylPREDNISolone acetate 40 MG/ML ?Outcome: tolerated well, no immediate complications ?Procedure, treatment alternatives, risks and benefits explained, specific risks discussed. Consent was given by the patient. Immediately prior to procedure a time out was  called to verify the correct patient, procedure, equipment, support staff and site/side marked as required. Patient was prepped and draped in the usual sterile fashion.  ? ? ? ? ?Clinical Data: ?No additional findings. ? ?Objective: ?Vital Signs: Ht 5\' 2"  (1.575 m)   Wt 294 lb (133.4 kg)   BMI 53.77 kg/m?  ? ?Physical Exam:  ?Constitutional: Patient appears well-developed ?HEENT:  ?Head: Normocephalic ?Eyes:EOM are normal ?Neck: Normal range of motion ?Cardiovascular: Normal rate ?Pulmonary/chest: Effort normal ?Neurologic: Patient is alert ?Skin: Skin is warm ?Psychiatric: Patient has normal mood and affect ? ?Ortho Exam: Ortho exam demonstrates right knee with 5 degree flexion contracture and 100 degrees of knee flexion.  Left knee with 5 degree flexion contracture and 105 degrees of knee flexion.  Able to perform straight leg raise with both legs.  No pain with hip range of motion.  She has tenderness over the medial lateral joint line severely of both knees.  No detectable effusion in either knee. ? ?Specialty Comments:  ?No specialty comments available. ? ?Imaging: ?No results found. ? ? ?PMFS History: ?Patient Active Problem List  ? Diagnosis Date Noted  ? Atypical chest pain 02/15/2020  ? OSA (obstructive sleep apnea) 10/11/2019  ? Preventative health care 05/10/2019  ? Normocytic anemia 05/10/2019  ? Resistant hypertension 03/08/2019  ? Morbid obesity with BMI of 50.0-59.9, adult (HCC) 02/15/2019  ? Bilateral chronic knee pain 02/15/2019  ? Chronic heart failure with preserved ejection fraction (HCC) 04/06/2018  ? ?Past Medical History:  ?Diagnosis Date  ? CHF (congestive heart failure) (HCC)   ? Dyspnea 03/29/2018  ? Essential hypertension 04/06/2018  ? Hypertension   ? Hypertensive urgency 03/29/2018  ?  ?Family History  ?Problem Relation Age of Onset  ? Prostate cancer Brother   ? Cancer - Other Brother   ?     Blood  ?  ?Past Surgical History:  ?Procedure Laterality Date  ? ABDOMINAL HYSTERECTOMY    ?  CHOLECYSTECTOMY    ? KNEE ARTHROSCOPY    ? TUBAL LIGATION    ? ?Social History  ? ?Occupational History  ? Not on file  ?Tobacco Use  ? Smoking status: Never  ? Smokeless tobacco: Never  ?Vaping Use  ? Vaping Use: Never used  ?Substance and Sexual Activity  ? Alcohol use: No  ? Drug use: No  ? Sexual activity: Not on file  ? ? ? ? ?  ?

## 2021-07-18 ENCOUNTER — Other Ambulatory Visit: Payer: Self-pay | Admitting: Student

## 2021-07-18 MED ORDER — SEMAGLUTIDE-WEIGHT MANAGEMENT 0.25 MG/0.5ML ~~LOC~~ SOAJ: 0.2500 mg | SUBCUTANEOUS | 0 refills | Status: AC

## 2021-07-22 ENCOUNTER — Ambulatory Visit: Payer: Managed Care, Other (non HMO)

## 2021-08-08 ENCOUNTER — Other Ambulatory Visit (HOSPITAL_COMMUNITY)
Admission: RE | Admit: 2021-08-08 | Discharge: 2021-08-08 | Disposition: A | Discharge status: Home / Self Care | Payer: Managed Care, Other (non HMO) | Source: Ambulatory Visit | Attending: Internal Medicine | Admitting: Internal Medicine

## 2021-08-08 ENCOUNTER — Ambulatory Visit: Payer: Managed Care, Other (non HMO) | Admitting: Internal Medicine

## 2021-08-08 ENCOUNTER — Encounter: Payer: Self-pay | Admitting: Internal Medicine

## 2021-08-08 VITALS — BP 140/86 | HR 83 | Temp 98.2°F | Ht 62.0 in | Wt 294.8 lb

## 2021-08-08 DIAGNOSIS — I110 Hypertensive heart disease with heart failure: Secondary | ICD-10-CM | POA: Diagnosis not present

## 2021-08-08 DIAGNOSIS — Z0000 Encounter for general adult medical examination without abnormal findings: Secondary | ICD-10-CM

## 2021-08-08 DIAGNOSIS — I5032 Chronic diastolic (congestive) heart failure: Secondary | ICD-10-CM

## 2021-08-08 DIAGNOSIS — R7303 Prediabetes: Secondary | ICD-10-CM | POA: Diagnosis not present

## 2021-08-08 DIAGNOSIS — I10 Essential (primary) hypertension: Secondary | ICD-10-CM | POA: Diagnosis not present

## 2021-08-08 MED ORDER — OLMESARTAN MEDOXOMIL-HCTZ 40-25 MG PO TABS: 1.0000 | ORAL_TABLET | Freq: Every day | ORAL | 3 refills | Status: DC

## 2021-08-08 MED ORDER — FUROSEMIDE 20 MG PO TABS: 20.0000 mg | ORAL_TABLET | Freq: Every day | ORAL | 3 refills | Status: DC | PRN

## 2021-08-08 NOTE — Patient Instructions (Addendum)
Ms Roniya Grabenstein, ? ?It was a pleasure seeing you in clinic. Today we discussed:  ? ?Blood pressure: ?Your blood pressure is improved today. ?At this time, recommend the following medication changes:  ?STOP LISINOPRIL40MG  DAILY AND HYDROCHLOROTHIAZIDE 25MG  DAILY ?START OLMESARTAN-HCTZ 40-25MG  DAILY ?CONTINUE SPIRONOLACTONE 50MG  DAILY  ?ONLY TAKE FUROSEMIDE 20MG  DAILY AS NEEDED FOR LEG SWELLING ?Please obtain a blood pressure machine and start checking your blood pressures at home.  ?Lab work obtained at this visit - I will call you with any abnormal results.  ? ?Dyspepsia: ?Resume over the counter omeprazole daily. If your symptoms are persistent, please contact us.  ? ?Routine healthcare: ?Pap smear performed at this visit.  ?Referral to GI placed for colonoscopy  ? ?If you have any questions or concerns, please call our clinic at [phone] between 9am-5pm and after hours call [phone] and ask for the internal medicine resident on call. If you feel you are having a medical emergency please call 911.  ? ?Thank you, we look forward to helping you remain healthy! ? ? ? ?

## 2021-08-08 NOTE — Progress Notes (Signed)
? ?  CC: hypertension follow up ? ?HPI: ? ?Maria Greene is a 61 y.o. female with PMHx as stated below presenting for follow up hypertension. Please see problem based charting for complete assessment and plan.  ? ?Past Medical History:  ?Diagnosis Date  ? CHF (congestive heart failure) (HCC)   ? Dyspnea 03/29/2018  ? Essential hypertension 04/06/2018  ? Hypertension   ? Hypertensive urgency 03/29/2018  ? ?Review of Systems:  Negative except as stated in HPI. ? ?Physical Exam: ? ?Vitals:  ? 08/08/21 1052 08/08/21 1135  ?BP: (!) 157/98 140/86  ?Pulse: 93 83  ?Temp: 98.2 ?F (36.8 ?C)   ?TempSrc: Oral   ?SpO2: 98%   ?Weight: 294 lb 12.8 oz (133.7 kg)   ?Height: 5\' 2"  (1.575 m)   ? ?Physical Exam  ?Constitutional: Middle aged severely obese female, no acute distress  ?Cardiovascular: Normal rate, regular rhythm, S1 and S2 present, no murmurs, rubs, gallops.  Distal pulses intact ?Respiratory:  Effort is normal.  Lungs are clear to auscultation bilaterally. ?Musculoskeletal: Normal bulk and tone.  Bilateral lower extremity nonpitting edema  ?Neurological: Is alert and oriented x4, no apparent focal deficits noted. ?Skin: Warm and dry.  No rash, erythema, lesions noted. ?GU: external genitalia nl; vaginal canal and cervix without discharge or lesions noted  ?Psychiatric: Normal mood and affect.  ? ?Assessment & Plan:  ? ?See Encounters Tab for problem based charting. ? ?Patient discussed with Dr. ? ?

## 2021-08-09 DIAGNOSIS — R7303 Prediabetes: Secondary | ICD-10-CM | POA: Insufficient documentation

## 2021-08-09 LAB — BMP8+ANION GAP
Anion Gap: 16 mmol/L (ref 10.0–18.0)
BUN/Creatinine Ratio: 11 (ref 9–23)
BUN: 13 mg/dL (ref 6–24)
CO2: 24 mmol/L (ref 20–29)
Calcium: 9 mg/dL (ref 8.7–10.2)
Chloride: 100 mmol/L (ref 96–106)
Creatinine, Ser: 1.15 mg/dL — ABNORMAL HIGH (ref 0.57–1.00)
Glucose: 83 mg/dL (ref 70–99)
Potassium: 4.4 mmol/L (ref 3.5–5.2)
Sodium: 140 mmol/L (ref 134–144)
eGFR: 55 mL/min/{1.73_m2} — ABNORMAL LOW (ref >59)

## 2021-08-09 LAB — HEMOGLOBIN A1C
Est. average glucose Bld gHb Est-mCnc: 126 mg/dL
Hgb A1c MFr Bld: 6 % — ABNORMAL HIGH (ref 4.8–5.6)

## 2021-08-09 NOTE — Assessment & Plan Note (Signed)
Pap smear performed at this visit ?Referral to GI for colonoscopy  ?

## 2021-08-09 NOTE — Assessment & Plan Note (Addendum)
HbA1c 6.0, has been in prediabetes range for past several years. In setting of severe obesity as well, would benefit from starting ozempic injections. If not covered by patient's insurance, can consider trulicity; however, this is not as beneficial for weight loss.  ? ?Plan: ?Ozempic 0.25mg  weekly, uptitrate as tolerated  ?Repeat A1c in 6 months ?

## 2021-08-09 NOTE — Progress Notes (Signed)
Internal Medicine Clinic Attending ° °Case discussed with Dr. Aslam  At the time of the visit.  We reviewed the resident’s history and exam and pertinent patient test results.  I agree with the assessment, diagnosis, and plan of care documented in the resident’s note.  °

## 2021-08-09 NOTE — Assessment & Plan Note (Signed)
BP Readings from Last 3 Encounters:  ?08/08/21 140/86  ?07/12/21 (!) 152/94  ?06/14/21 (!) 162/103  ? ?Patient is presenting today for hypertension follow up. She is currently on lisinopril 40mg  daily, HCTZ 25mg  daily, spironolactone 50mg  daily and furosemide 20mg  daily. She reports compliance with medications and is tolerating this well. 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 She denies any chest pain, dyspnea, headaches, lightheadedness/dizziness, or weakness.  ?Blood pressure is improved today from prior visits. She does have OSA and has had some trouble with obtaining CPAP machine given new insurance. Will follow up on this from previous visit.  ?BMP at this visit with stable renal function and no electrolyte derangements. Will continue with current regimen; however, may benefit from combination pill to decrease pill burden. ? ?Plan: ?Change lisinopril and HCTZ to olmesartan-HCTZ 40-25mg  daily ?Continue spirinolactone 50mg  daily  ?Change lasix 20mg  daily to as needed  ?Follow up in 4 weeks for BP check ?

## 2021-08-12 ENCOUNTER — Ambulatory Visit: Payer: Managed Care, Other (non HMO)

## 2021-08-12 LAB — CYTOLOGY - PAP
Comment: NEGATIVE
Diagnosis: NEGATIVE
High risk HPV: NEGATIVE

## 2021-09-23 IMAGING — CT CT ANGIO CHEST-ABD-PELV FOR DISSECTION W/ AND WO/W CM
2 of 7 series · 15 of 46 positions shown, 17 images · IV contrast (OMNI)
Comparison: March 29, 2018

CLINICAL DATA: Chest pressure.

EXAM:
CT ANGIOGRAPHY CHEST, ABDOMEN AND PELVIS
TECHNIQUE: Non-contrast CT of the chest was initially obtained.

[Series 5: dissection 3.0 i30f 3 · axial · 0.98mm/px · z∈[+829,+1342]mm · 12 of 193 slices shown, 14 images]
[im 11/193  soft-tissue]
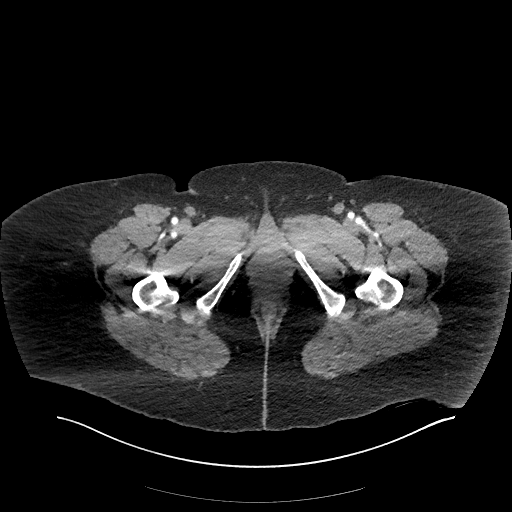
[im 11/193  bone]
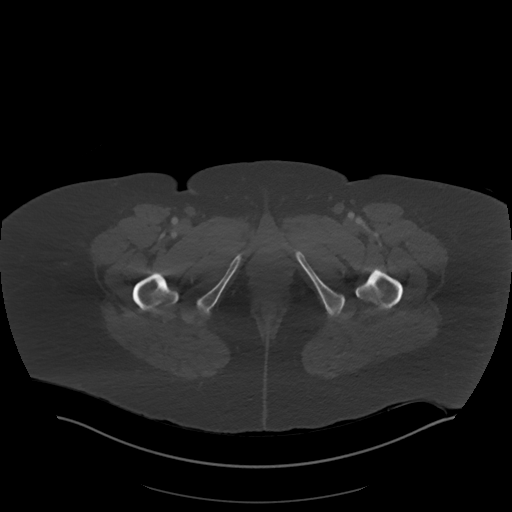
[im 33/193  soft-tissue]
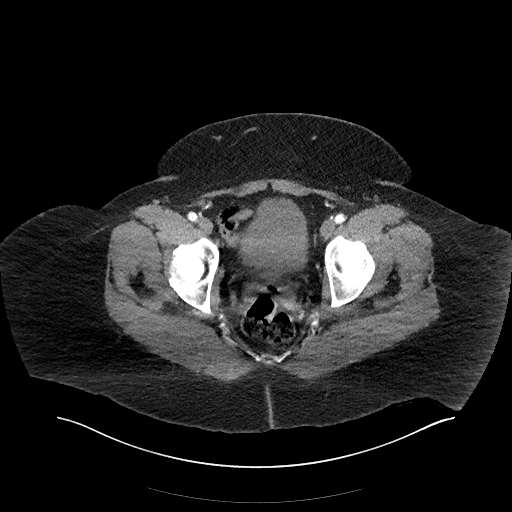
[im 43/193  soft-tissue]
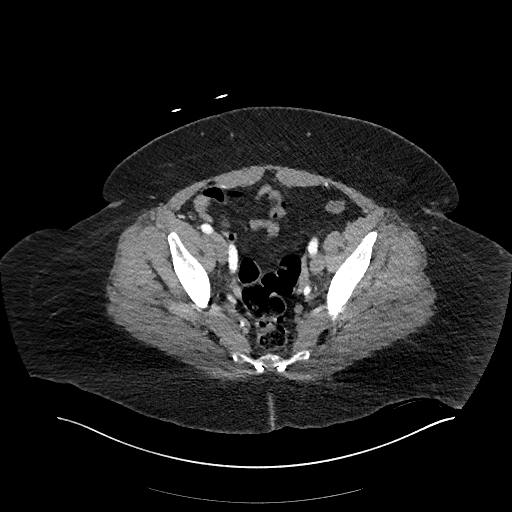
[im 54/193  soft-tissue]
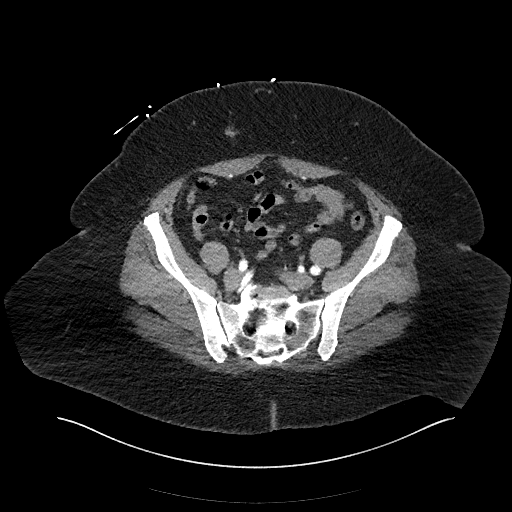
[im 75/193  soft-tissue]
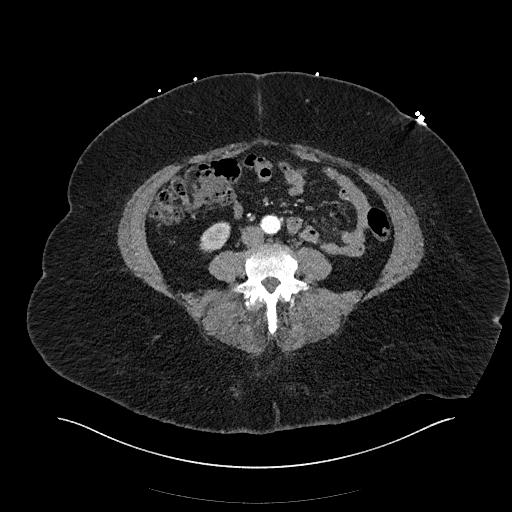
[im 86/193  soft-tissue]
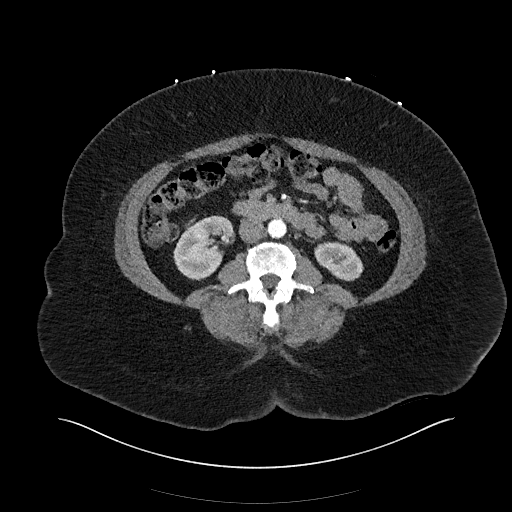
[im 107/193  soft-tissue]
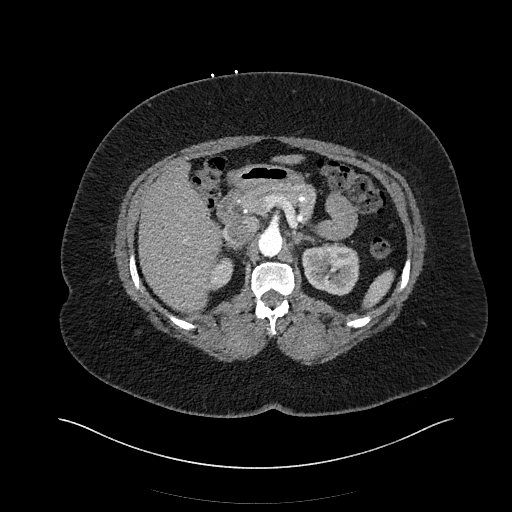
[im 118/193  soft-tissue]
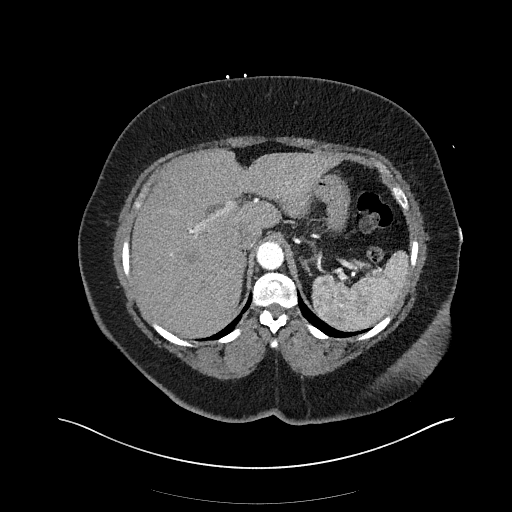
[im 139/193  soft-tissue]
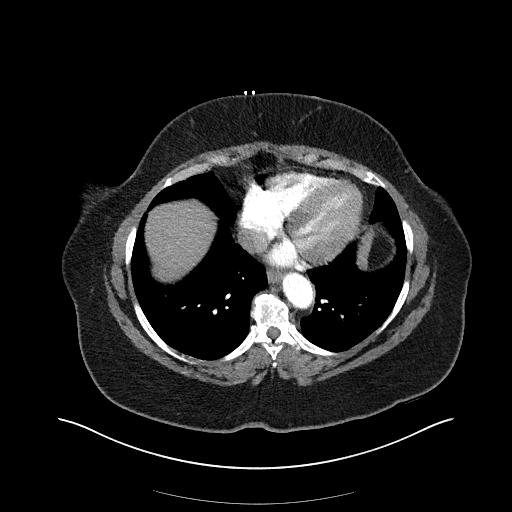
[im 139/193  bone]
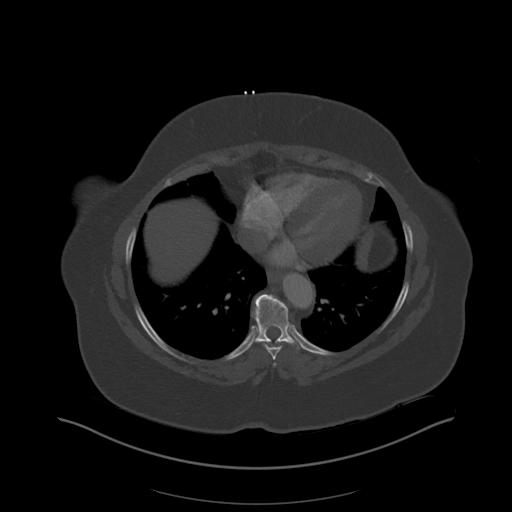
[im 150/193  soft-tissue]
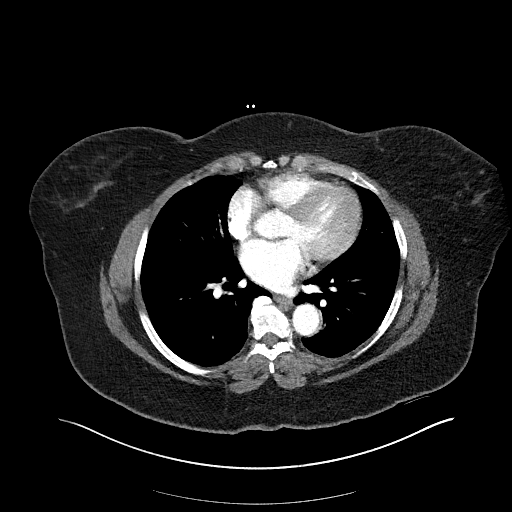
[im 161/193  soft-tissue]
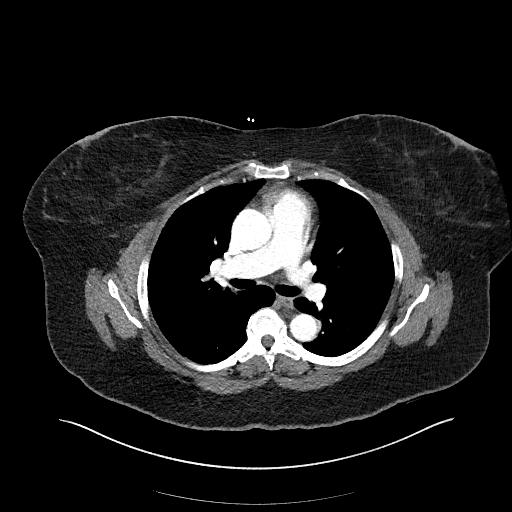
[im 182/193  soft-tissue]
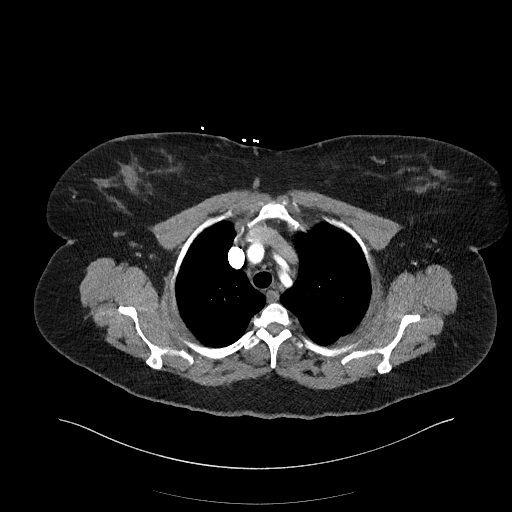

[Series 8: coronals · coronal · 1.00mm/px · 3 of 191 slices shown]
[im 48/191  soft-tissue]
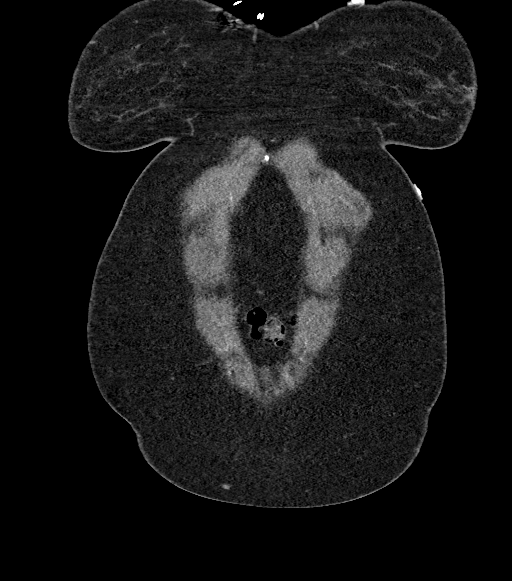
[im 96/191  soft-tissue]
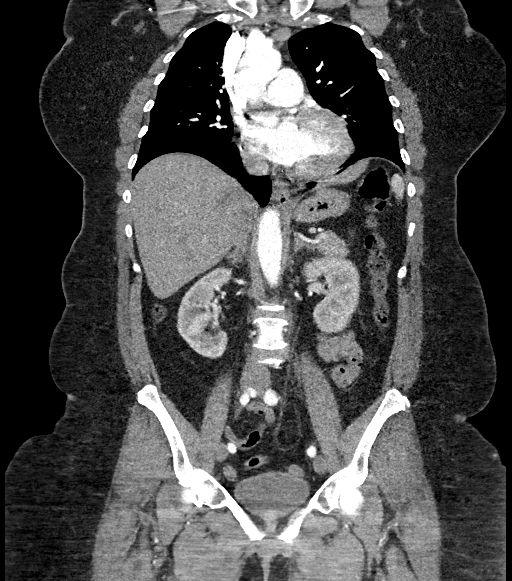
[im 143/191  soft-tissue]
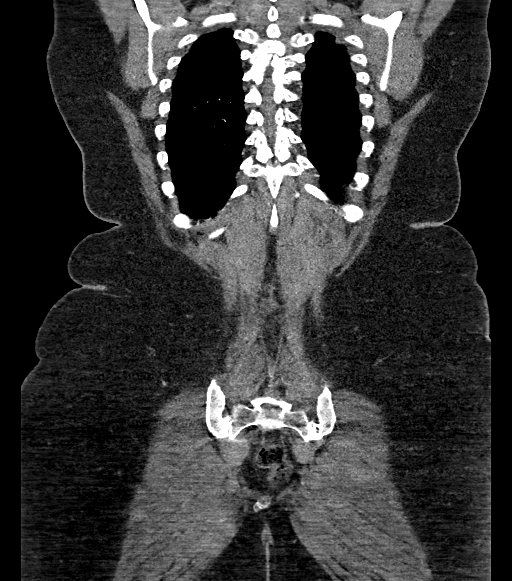

[15 of 46 positions shown; findings below may reference images not displayed]

Multidetector CT imaging through the chest, abdomen and pelvis was
performed using the standard protocol during bolus administration of
intravenous contrast. Multiplanar reconstructed images and MIPs were
obtained and reviewed to evaluate the vascular anatomy.

CONTRAST:  100mL OMNIPAQUE IOHEXOL 350 MG/ML SOLN
FINDINGS: CTA CHEST FINDINGS

Cardiovascular: The thoracic aorta is normal in appearance, without
evidence of aneurysmal dilatation or dissection. Satisfactory
opacification of the pulmonary arteries to the segmental level. No
evidence of pulmonary embolism. Normal heart size. No pericardial
effusion.

Mediastinum/Nodes: No enlarged mediastinal, hilar, or axillary lymph
nodes. Thyroid gland, trachea, and esophagus demonstrate no
significant findings.

Lungs/Pleura: Very mild linear scarring and/or atelectasis is seen
within the lingular region.

There is no evidence of acute infiltrate, pleural effusion or
pneumothorax.

Musculoskeletal: No chest wall abnormality. No acute or significant
osseous findings.

Review of the MIP images confirms the above findings.

CTA ABDOMEN AND PELVIS FINDINGS

VASCULAR

Aorta: Normal caliber aorta without aneurysm, dissection, vasculitis
or significant stenosis.

Celiac: Patent without evidence of aneurysm, dissection, vasculitis
or significant stenosis.

SMA: Patent without evidence of aneurysm, dissection, vasculitis or
significant stenosis.

Renals: Both renal arteries are patent without evidence of aneurysm,
dissection, vasculitis, fibromuscular dysplasia or significant
stenosis.

IMA: Patent without evidence of aneurysm, dissection, vasculitis or
significant stenosis.

Inflow: Patent without evidence of aneurysm, dissection, vasculitis
or significant stenosis.

Veins: No obvious venous abnormality within the limitations of this
arterial phase study.

Review of the MIP images confirms the above findings.

NON-VASCULAR

Hepatobiliary: No focal liver abnormality is seen. Status post
cholecystectomy. No biliary dilatation.

Pancreas: Unremarkable. No pancreatic ductal dilatation or
surrounding inflammatory changes.

Spleen: Normal in size without focal abnormality.

Adrenals/Urinary Tract: Adrenal glands are unremarkable. Kidneys are
normal, without renal calculi, focal lesion, or hydronephrosis.
Bladder is unremarkable.

Stomach/Bowel: Stomach is within normal limits. Appendix appears
normal. No evidence of bowel wall thickening, distention, or
inflammatory changes.

Lymphatic: No abnormal abdominal or pelvic lymph nodes are
identified.

Reproductive: Status post hysterectomy. No adnexal masses.

Other: No abdominal wall hernia or abnormality. No abdominopelvic
ascites.

Musculoskeletal: No acute or significant osseous findings.

Review of the MIP images confirms the above findings.
IMPRESSION: 1. No evidence of aortic dissection or aneurysm.
2. No evidence of pulmonary embolism.
3. No acute intrathoracic or intra-abdominal pathology.
4. Status post cholecystectomy and hysterectomy.

## 2021-10-30 ENCOUNTER — Ambulatory Visit: Payer: Commercial Managed Care - HMO | Admitting: Internal Medicine

## 2021-10-30 VITALS — BP 195/116 | HR 93 | Temp 98.2°F | Ht 62.0 in | Wt 296.5 lb

## 2021-10-30 DIAGNOSIS — I10 Essential (primary) hypertension: Secondary | ICD-10-CM

## 2021-10-30 DIAGNOSIS — N1831 Chronic kidney disease, stage 3a: Secondary | ICD-10-CM | POA: Diagnosis not present

## 2021-10-30 DIAGNOSIS — B070 Plantar wart: Secondary | ICD-10-CM

## 2021-10-30 DIAGNOSIS — R7303 Prediabetes: Secondary | ICD-10-CM

## 2021-10-30 DIAGNOSIS — I1A0 Resistant hypertension: Secondary | ICD-10-CM

## 2021-10-30 DIAGNOSIS — G4733 Obstructive sleep apnea (adult) (pediatric): Secondary | ICD-10-CM

## 2021-10-30 DIAGNOSIS — I129 Hypertensive chronic kidney disease with stage 1 through stage 4 chronic kidney disease, or unspecified chronic kidney disease: Secondary | ICD-10-CM | POA: Diagnosis not present

## 2021-10-30 MED ORDER — SPIRONOLACTONE 50 MG PO TABS: ORAL_TABLET | ORAL | 3 refills | Status: DC

## 2021-10-30 MED ORDER — OLMESARTAN MEDOXOMIL-HCTZ 40-25 MG PO TABS: 1.0000 | ORAL_TABLET | Freq: Every day | ORAL | 3 refills | Status: DC

## 2021-10-30 MED ORDER — TRULICITY 0.75 MG/0.5ML ~~LOC~~ SOAJ: 0.7500 mg | SUBCUTANEOUS | 4 refills | Status: DC

## 2021-10-30 NOTE — Patient Instructions (Addendum)
Thank you, Maria Greene for allowing [place] to provide your care today. Today we discussed:  Blood pressure- Please pick up benicar (Olmesartan- HCTZ combo pill) and Spironolactone.  STOP taking lisinopril and individual HCTZ.  Plantar wart- We froze that today. You may have soreness and a blister will form. Come back in 4 weeks and we will take another look at the wart.    Sleep study- I put in a referral for home sleep study you should get a call.  Colonoscopy- [phone] [facility]  Weight loss goals- I've sent in another medication that is in same drug class as ozempic. We will see if that medication is covered.  I have ordered the following labs for you:  Lab Orders  No laboratory test(s) ordered today     Referrals ordered today:   Referral Orders  No referral(s) requested today     I have ordered the following medication/changed the following medications:   Stop the following medications: There are no discontinued medications.   Start the following medications: No orders of the defined types were placed in this encounter.    Follow up:  4 weeks    We look forward to seeing you next time. Please call our clinic at [phone] if you have any questions or concerns. The best time to call is Monday-Friday from 9am-4pm, but there is someone available 24/7. If after hours or the weekend, call the main hospital number and ask for the Internal Medicine Resident On-Call. If you need medication refills, please notify your pharmacy one week in advance and they will send [place] a request.   Thank you for trusting me with your care. Wishing you the best!   Rudene Christians, DO [facility]

## 2021-10-30 NOTE — Progress Notes (Signed)
Subjective:  CC: blood pressure and sleep study  HPI:  Maria Greene is a 61 y.o. female with a past medical history stated below and presents today for hypertension, obstructive sleep apnea, prediabetes, and lesion on foot. Please see problem based assessment and plan for additional details.  Past Medical History:  Diagnosis Date   CHF (congestive heart failure) (HCC)    Dyspnea 03/29/2018   Essential hypertension 04/06/2018   Hypertension    Hypertensive urgency 03/29/2018    Current Outpatient Medications on File Prior to Visit  Medication Sig Dispense Refill   celecoxib (CELEBREX) 100 MG capsule Take 1 capsule (100 mg total) by mouth 2 (two) times daily. Do not take with Advil 60 capsule 0   furosemide (LASIX) 20 MG tablet Take 1 tablet (20 mg total) by mouth daily as needed (leg swelling). 90 tablet 3   Multiple Vitamins-Minerals (MULTI FOR HER 50+) TABS Take 1 tablet by mouth daily.     No current facility-administered medications on file prior to visit.    Family History  Problem Relation Age of Onset   Prostate cancer Brother    Cancer - Other Brother        Blood    Social History   Socioeconomic History   Marital status: Divorced    Spouse name: Not on file   Number of children: Not on file   Years of education: Not on file   Highest education level: Not on file  Occupational History   Not on file  Tobacco Use   Smoking status: Never   Smokeless tobacco: Never  Vaping Use   Vaping Use: Never used  Substance and Sexual Activity   Alcohol use: No   Drug use: No   Sexual activity: Not on file  Other Topics Concern   Not on file  Social History Narrative   Not on file   Social Determinants of Health   Financial Resource Strain: Not on file  Food Insecurity: Not on file  Transportation Needs: Not on file  Physical Activity: Not on file  Stress: Not on file  Social Connections: Not on file  Intimate Partner Violence: Not on file    Review of  Systems: ROS negative except for what is noted on the assessment and plan.  Objective:   Vitals:   10/30/21 1440 10/30/21 1444  BP: (!) 201/110 (!) 195/116  Pulse: 91 93  Temp: 98.2 F (36.8 C)   TempSrc: Oral   SpO2: 97%   Weight: 296 lb 8 oz (134.5 kg)   Height: 5\' 2"  (1.575 m)     Physical Exam: Constitutional: Morbidly obese, in no acute distress Cardiovascular: regular rate and rhythm, no m/r/g Pulmonary/Chest: normal work of breathing on room air, lungs clear to auscultation bilaterally Abdominal: soft, non-tender, non-distended MSK: Pain with ambulation, antalgic gait, flat, painful lesion on plantar surface of left foot Skin: warm and dry Psych: Normal mood and affect   Assessment & Plan:  OSA (obstructive sleep apnea) Patient states that she underwent sleep study about 2 years ago.  On chart review there is a sleep document in the chart but no record of the actual study.  She endorses snoring and waking up gasping for air.  She does not feel like she is rested when she wakes up.  Pain score is elevated at 7. A/P: Home sleep study ordered  Resistant hypertension Ms. Thelander presents with uncontrolled hypertension.  Her blood pressure initially was elevated at 201/110.  When  this was rechecked it decreased to 195/116.  She has her for medications with her today.  They include lisinopril 40 mg, hydrochlorothiazide 25 mg, spironolactone 50 mg, and furosemide 20 mg.  She states that she takes the first 3 daily and takes furosemide about once weekly when her swelling in her legs is severe.  On chart review Benicar was ordered at last visit to decrease pill burden.  Patient has not picked up lisinopril hydrochlorothiazide and spironolactone in some time.  Uncontrolled blood pressure is multifactorial secondary to medication nonadherence and untreated sleep apnea.  She states that she becomes confused after office visits because she sees so many different providers and her  medications are always changing.  She has been trying a supplement called Ivan Anchors for the last month and states when she checks her blood pressure at home it is at 140/80. A/P: Start olmesartan-hydrochlorothiazide 40-25 mg daily Continue spironolactone 50 mg daily Furosemide 20 mg as needed Stop lisinopril Stop chlorothiazide Home sleep study Follow-up in 1 month with Dr. Criselda Peaches  CKD (chronic kidney disease) stage 3, GFR 30-59 ml/min (HCC) BMP in April showed creatinine of 1.15 with GFR at 55.  Last 2 BMPs reflect CKD stage IIIa.  This is likely secondary to uncontrolled hypertension A/P: Repeat BMP in October  Prediabetes Patient with history of prediabetes and morbid obesity states that she never received prescription for Ozempic.  She is unsure if medication was sent in.  She wants to lose weight so that she can undergo knee replacements. A/P: On chart review Ozempic has previously been since then, I do not see a prior Auth for denial for prior Auth.  I will try sending in Trulicity to see if this might be covered. Follow-up with Dr. Criselda Peaches in 1 month  Plantar wart of left foot Patient presents with history of wart for past couple of months. She states that she has been shaving the wart as it is very painful to put on a sock or a shoe.  The wart seems to be getting larger.  On physical exam there is a well-defined, round raised region that is painful to touch. A/P: Procedure Note PRE-OP DIAGNOSIS: plantar wart POST-OP DIAGNOSIS: Same  PROCEDURE: skin lesion excision  Performing Physician: Rudene Christians Supervising Physician (if applicable):Dr. Oswaldo Done PROCEDURE:  Cryotherapy  Followup: The patient tolerated the procedure well without complications. Standard post-procedure care is explained and return precautions are given.    Patient discussed with Dr. Josetta Huddle Soren Lazarz, D.O. [facility] Internal [facility]  PGY-2 Pager: [phone]  Phone: [phone] Date  10/31/2021  Time 6:54 AM

## 2021-10-31 ENCOUNTER — Encounter: Payer: Self-pay | Admitting: Gastroenterology

## 2021-10-31 DIAGNOSIS — B070 Plantar wart: Secondary | ICD-10-CM | POA: Insufficient documentation

## 2021-10-31 DIAGNOSIS — N183 Chronic kidney disease, stage 3 unspecified: Secondary | ICD-10-CM | POA: Insufficient documentation

## 2021-10-31 NOTE — Assessment & Plan Note (Signed)
Ms. Rickerson presents with uncontrolled hypertension.  Her blood pressure initially was elevated at 201/110.  When this was rechecked it decreased to 195/116.  She has her for medications with her today.  They include lisinopril 40 mg, hydrochlorothiazide 25 mg, spironolactone 50 mg, and furosemide 20 mg.  She states that she takes the first 3 daily and takes furosemide about once weekly when her swelling in her legs is severe.  On chart review Benicar was ordered at last visit to decrease pill burden.  Patient has not picked up lisinopril hydrochlorothiazide and spironolactone in some time.  Uncontrolled blood pressure is multifactorial secondary to medication nonadherence and untreated sleep apnea.  She states that she becomes confused after office visits because she sees so many different providers and her medications are always changing.  She has been trying a supplement called Ivan Anchors for the last month and states when she checks her blood pressure at home it is at 140/80. A/P: Start olmesartan-hydrochlorothiazide 40-25 mg daily Continue spironolactone 50 mg daily Furosemide 20 mg as needed Stop lisinopril Stop chlorothiazide Home sleep study Follow-up in 1 month with Dr. Criselda Peaches

## 2021-10-31 NOTE — Assessment & Plan Note (Signed)
Patient states that she underwent sleep study about 2 years ago.  On chart review there is a sleep document in the chart but no record of the actual study.  She endorses snoring and waking up gasping for air.  She does not feel like she is rested when she wakes up.  Pain score is elevated at 7. A/P: Home sleep study ordered

## 2021-10-31 NOTE — Assessment & Plan Note (Signed)
Patient presents with history of wart for past couple of months. She states that she has been shaving the wart as it is very painful to put on a sock or a shoe.  The wart seems to be getting larger.  On physical exam there is a well-defined, round raised region that is painful to touch. A/P: Procedure Note PRE-OP DIAGNOSIS: plantar wart POST-OP DIAGNOSIS: Same  PROCEDURE: skin lesion excision  Performing Physician: Rudene Christians Supervising Physician (if applicable):Dr. Oswaldo Done PROCEDURE:  Cryotherapy  Followup: The patient tolerated the procedure well without complications. Standard post-procedure care is explained and return precautions are given.

## 2021-10-31 NOTE — Progress Notes (Signed)
Internal Medicine Clinic Attending  I saw and evaluated the patient.  I personally confirmed the key portions of the history and exam documented by Dr. Masters and I reviewed pertinent patient test results.  The assessment, diagnosis, and plan were formulated together and I agree with the documentation in the resident's note. I was present for the entirety of the procedure. 

## 2021-10-31 NOTE — Assessment & Plan Note (Signed)
BMP in April showed creatinine of 1.15 with GFR at 55.  Last 2 BMPs reflect CKD stage IIIa.  This is likely secondary to uncontrolled hypertension A/P: Repeat BMP in October

## 2021-10-31 NOTE — Assessment & Plan Note (Signed)
Patient with history of prediabetes and morbid obesity states that she never received prescription for Ozempic.  She is unsure if medication was sent in.  She wants to lose weight so that she can undergo knee replacements. A/P: On chart review Ozempic has previously been since then, I do not see a prior Auth for denial for prior Auth.  I will try sending in Trulicity to see if this might be covered. Follow-up with Dr. Criselda Peaches in 1 month

## 2021-11-01 ENCOUNTER — Telehealth: Payer: Self-pay | Admitting: *Deleted

## 2021-11-01 NOTE — Telephone Encounter (Signed)
Dr.Armbruster,  Patient is scheduled for a direct screening colonoscopy with you. Her last BMI in July was 54. H/o HTN,CHF, CKD, OSA. EF=55-60%. Okay for direct hospital colonoscopy or OV?  Please advise. Thank you, Ovella Manygoats PV

## 2021-11-01 NOTE — Telephone Encounter (Signed)
It would be best to see this patient in the office first. Thanks

## 2021-11-04 NOTE — Telephone Encounter (Signed)
Called patient, no answer. Unable to leave message "mail box is full". Colon and PV cancelled.

## 2021-11-04 NOTE — Telephone Encounter (Signed)
Patient notified. Made OV with Dr.Armbruster for Sept 1 at 210 pm. Made appointment information to pt.

## 2021-11-27 ENCOUNTER — Ambulatory Visit (INDEPENDENT_AMBULATORY_CARE_PROVIDER_SITE_OTHER): Payer: Commercial Managed Care - HMO | Admitting: Internal Medicine

## 2021-11-27 ENCOUNTER — Encounter: Payer: Self-pay | Admitting: Internal Medicine

## 2021-11-27 DIAGNOSIS — I10 Essential (primary) hypertension: Secondary | ICD-10-CM

## 2021-11-27 DIAGNOSIS — B070 Plantar wart: Secondary | ICD-10-CM | POA: Diagnosis not present

## 2021-11-27 DIAGNOSIS — I1A0 Resistant hypertension: Secondary | ICD-10-CM

## 2021-11-27 NOTE — Patient Instructions (Signed)
Ms. Prioleau,  Your blood pressure looks great today! I am making no changes to your current medications.  For your foot wart, I would like you to schedule an appointment with me at my next available time to treat this again.  My best, Dr. August Saucer

## 2021-11-28 NOTE — Assessment & Plan Note (Signed)
BP controlled today at 133/82. Current regimen of olmesartan-HCTZ 40-25 mg daily, spironolactone 50 mg daily, and lasix 20 mg PRN. Patient took medications this morning. Plan:Continue current regimen.

## 2021-11-28 NOTE — Assessment & Plan Note (Signed)
Patient presents for follow-up of L foot plantar wart that underwent cryotherapy on 07/13. She states that the wart is much improved and has decreased in size significantly, now nearly flat. She no longer has significant pain with the area and is now able to wear shoes where previously she was limited by pain. Assessment: Wart on plantar foot is improved but still present and would benefit from repeat cryotherapy. Plan:Unfortunately today I do not have the ability to perform this procedure on the wart. Patient will schedule with my next available slot to do this.

## 2021-11-28 NOTE — Progress Notes (Signed)
   CC: BP, wart f/u  HPI:  [name] is a 61 y.o. person with past medical history as detailed below who presents today for BP and L foot plantar wart f/u. Please see problem based charting for detailed assessment and plan.  Past Medical History:  Diagnosis Date   CHF (congestive heart failure) (HCC)    Dyspnea 03/29/2018   Essential hypertension 04/06/2018   Hypertension    Hypertensive urgency 03/29/2018   Review of Systems:  Negative unless otherwise stated.  Physical Exam:  Vitals:   11/27/21 1029  BP: 133/82  Pulse: 96  Temp: 98.2 F (36.8 C)  TempSrc: Oral  SpO2: 95%  Weight: 285 lb (129.3 kg)  Height: 5\' 2"  (1.575 m)   Constitutional:Well appearing, in no acute distress. Cardio:Regular rate and rhythm.  Pulm:Clear to auscultation bilaterally.  for extremity edema. Skin:L foot plantar surface with focal hyperkeratotic area, circular and ~1.5cm with dark central area. Minimal pain to palpation of the area.  Neuro:Alert and oriented x3. No focal deficit noted. Psych:Pleasant mood and affect.  Assessment & Plan:   See Encounters Tab for problem based charting.  Resistant hypertension BP controlled today at 133/82. Current regimen of olmesartan-HCTZ 40-25 mg daily, spironolactone 50 mg daily, and lasix 20 mg PRN. Patient took medications this morning. Plan:Continue current regimen.  Plantar wart of left foot Patient presents for follow-up of L foot plantar wart that underwent cryotherapy on 07/13. She states that the wart is much improved and has decreased in size significantly, now nearly flat. She no longer has significant pain with the area and is now able to wear shoes where previously she was limited by pain. Assessment: Wart on plantar foot is improved but still present and would benefit from repeat cryotherapy. Plan:Unfortunately today I do not have the ability to perform this procedure on the wart. Patient will schedule with my next available  slot to do this.  Patient discussed with Dr. 8/13

## 2021-11-29 NOTE — Progress Notes (Signed)
Internal Medicine Clinic Attending ? ?Case discussed with Dr. Dean  At the time of the visit.  We reviewed the resident?s history and exam and pertinent patient test results.  I agree with the assessment, diagnosis, and plan of care documented in the resident?s note.  ?

## 2021-12-02 ENCOUNTER — Ambulatory Visit (INDEPENDENT_AMBULATORY_CARE_PROVIDER_SITE_OTHER): Payer: Commercial Managed Care - HMO | Admitting: Internal Medicine

## 2021-12-02 DIAGNOSIS — B070 Plantar wart: Secondary | ICD-10-CM | POA: Diagnosis not present

## 2021-12-02 NOTE — Patient Instructions (Addendum)
Maria Greene,  We again freezed the wart on your foot today. You may have some soreness over the area, and a blister may form. We can take a look at the wart again in 4 weeks if it has still not resolved.  My best,  Dr. August Saucer

## 2021-12-03 NOTE — Progress Notes (Signed)
   CC: cryotherapy of L plantar foot wart  HPI:  [name] is a 61 y.o. person with past medical history as detailed below who presents today for repeat cryotherapy of L foot plantar wart. Please see problem based charting for detailed assessment and plan.  Past Medical History:  Diagnosis Date   CHF (congestive heart failure) (HCC)    Dyspnea 03/29/2018   Essential hypertension 04/06/2018   Hypertension    Hypertensive urgency 03/29/2018   Review of Systems:  Negative unless otherwise stated.  Physical Exam:  Vitals:   12/02/21 1538  BP: (!) 146/87  Pulse: 100  SpO2: 98%  Weight: 286 lb 12.8 oz (130.1 kg)  Height: 5\' 2"  (1.575 m)   Constitutional:Well appearing, in no acute distress. Cardio:Regular rate and rhythm.  Pulm:Clear to auscultation bilaterally.  for extremity edema. Skin:L foot plantar surface with focal hyperkeratotic area, circular and ~1.5cm with dark central area. Minimal pain to palpation of the area.  Neuro:Alert and oriented x3. No focal deficit noted. Psych:Pleasant mood and affect.  Assessment & Plan:   See Encounters Tab for problem based charting.  Plantar wart of left foot Patient returns for cryotherapy of plantar wart on L foot.  PRE-OP DIAGNOSIS: Plantar wart, L foot POST-OP DIAGNOSIS: Plantar wart, L foot  PROCEDURE: Cryotherapy Performing Physician: Dr. KGS:[id number] Supervising Physician (if applicable):Dr. August Saucer PROCEDURE:  Cryotherapy  The patient tolerated the procedure well without complications. Standard post-procedure care is explained and return precautions are given.  Patient seen with Dr.  Lafonda Mosses

## 2021-12-03 NOTE — Assessment & Plan Note (Signed)
Patient returns for cryotherapy of plantar wart on L foot.  PRE-OP DIAGNOSIS: Plantar wart, L foot POST-OP DIAGNOSIS: Plantar wart, L foot  PROCEDURE: Cryotherapy Performing Physician: Dr. August Saucer Supervising Physician (if applicable):Dr. Lafonda Mosses PROCEDURE:  Cryotherapy  The patient tolerated the procedure well without complications. Standard post-procedure care is explained and return precautions are given.

## 2021-12-03 NOTE — Progress Notes (Signed)
Internal Medicine Clinic Attending  I saw and evaluated the patient.  I personally confirmed the key portions of the history and exam documented by Dr.  Dean  and I reviewed pertinent patient test results.  The assessment, diagnosis, and plan were formulated together and I agree with the documentation in the resident's note.  

## 2021-12-06 ENCOUNTER — Encounter: Payer: Commercial Managed Care - HMO | Admitting: Gastroenterology

## 2021-12-13 ENCOUNTER — Encounter: Payer: Managed Care, Other (non HMO) | Admitting: Internal Medicine

## 2021-12-20 ENCOUNTER — Ambulatory Visit: Payer: Commercial Managed Care - HMO | Admitting: Gastroenterology

## 2022-01-03 ENCOUNTER — Encounter: Payer: Self-pay | Admitting: Internal Medicine

## 2022-01-03 ENCOUNTER — Other Ambulatory Visit: Payer: Self-pay

## 2022-01-03 ENCOUNTER — Ambulatory Visit (INDEPENDENT_AMBULATORY_CARE_PROVIDER_SITE_OTHER): Payer: Commercial Managed Care - HMO | Admitting: Internal Medicine

## 2022-01-03 VITALS — BP 128/85 | HR 90 | Temp 98.6°F | Ht 62.0 in | Wt 285.4 lb

## 2022-01-03 DIAGNOSIS — R7303 Prediabetes: Secondary | ICD-10-CM | POA: Diagnosis not present

## 2022-01-03 DIAGNOSIS — K219 Gastro-esophageal reflux disease without esophagitis: Secondary | ICD-10-CM

## 2022-01-03 DIAGNOSIS — I5032 Chronic diastolic (congestive) heart failure: Secondary | ICD-10-CM | POA: Diagnosis not present

## 2022-01-03 DIAGNOSIS — Z6841 Body Mass Index (BMI) 40.0 and over, adult: Secondary | ICD-10-CM | POA: Diagnosis not present

## 2022-01-03 DIAGNOSIS — B070 Plantar wart: Secondary | ICD-10-CM

## 2022-01-03 DIAGNOSIS — L600 Ingrowing nail: Secondary | ICD-10-CM

## 2022-01-03 DIAGNOSIS — I130 Hypertensive heart and chronic kidney disease with heart failure and stage 1 through stage 4 chronic kidney disease, or unspecified chronic kidney disease: Secondary | ICD-10-CM | POA: Diagnosis not present

## 2022-01-03 DIAGNOSIS — Z0000 Encounter for general adult medical examination without abnormal findings: Secondary | ICD-10-CM

## 2022-01-03 DIAGNOSIS — N1831 Chronic kidney disease, stage 3a: Secondary | ICD-10-CM

## 2022-01-03 DIAGNOSIS — I10 Essential (primary) hypertension: Secondary | ICD-10-CM

## 2022-01-03 DIAGNOSIS — I1A0 Resistant hypertension: Secondary | ICD-10-CM

## 2022-01-03 DIAGNOSIS — G4733 Obstructive sleep apnea (adult) (pediatric): Secondary | ICD-10-CM

## 2022-01-03 LAB — GLUCOSE, CAPILLARY: Glucose-Capillary: 97 mg/dL (ref 70–99)

## 2022-01-03 LAB — POCT GLYCOSYLATED HEMOGLOBIN (HGB A1C): Hemoglobin A1C: 6.1 % — AB (ref 4.0–5.6)

## 2022-01-03 MED ORDER — ONDANSETRON HCL 4 MG PO TABS: 4.0000 mg | ORAL_TABLET | Freq: Every day | ORAL | 1 refills | Status: DC | PRN

## 2022-01-03 MED ORDER — SALICYLIC ACID WART REMOVER 27.5 % EX LIQD: 1.0000 | Freq: Every day | CUTANEOUS | 1 refills | Status: AC

## 2022-01-03 MED ORDER — OMEPRAZOLE 20 MG PO CPDR: 20.0000 mg | DELAYED_RELEASE_CAPSULE | Freq: Every day | ORAL | 3 refills | Status: DC

## 2022-01-03 MED ORDER — TRULICITY 1.5 MG/0.5ML ~~LOC~~ SOAJ: 1.5000 mg | SUBCUTANEOUS | 3 refills | Status: DC

## 2022-01-03 NOTE — Assessment & Plan Note (Signed)
BP is well controlled today at 128/85 which I congratulated her on.  She is taking benicar and spironolactone with good results.  She also feels like her swelling is better on this combination.    Plan Continue benicar and spironolactone - BMET today

## 2022-01-03 NOTE — Progress Notes (Signed)
Established Patient Office Visit  Subjective   Patient ID: Maria Greene, female    DOB: March 18, 1961  Age: 60 y.o. MRN: [id number]  Chief Complaint  Patient presents with   Check-up Visit    Left foot pain.    Maria Greene is a 61 yo woman with PMH of HFpEF (well controlled), obesity, chronic knee pain, HTN, anemia, OSA, pre-DM who presents for follow up.   She notes wart on the foot was improved for a while and then worsened, causing pain.  Ingrown toenail on the left great toe.  She would prefer to get treated here.  We discussed making a procedure appointment for the ingrown toenail.  We also discussed salicylic acid treatment for the wart.    Constipation thought to be due to trulicity --> fiber.  She notes that fiber usually helps her.  She is interested in going up on her trulicity, despite nausea.  She has lost 10 pounds and would like to see if she can lose some more.   Prilosec and zofran for nausea and heartburn related to trulicity.     Can't get colon --> too expensive, FIT testing is reasonable for her, no FH of colon cancer.   Schedule in 1-2 weeks for ingrown toenail removal    Patient Active Problem List   Diagnosis Date Noted   Ingrown toenail of left foot 01/03/2022   CKD (chronic kidney disease) stage 3, GFR 30-59 ml/min (HCC) 10/31/2021   Plantar wart of left foot 10/31/2021   Prediabetes 08/09/2021   OSA (obstructive sleep apnea) 10/11/2019   Preventative health care 05/10/2019   Normocytic anemia 05/10/2019   Resistant hypertension 03/08/2019   Morbid obesity with BMI of 50.0-59.9, adult (HCC) 02/15/2019   Bilateral chronic knee pain 02/15/2019   Chronic heart failure with preserved ejection fraction (HCC) 04/06/2018      Review of Systems  Constitutional:  Positive for weight loss (intentional, with GLP-1). Negative for chills and fever.  Respiratory:  Negative for cough and wheezing.   Cardiovascular:  Negative for chest pain.  Gastrointestinal:   Positive for abdominal pain, constipation, heartburn and nausea. Negative for blood in stool, diarrhea and melena.  Musculoskeletal:        She has pain over a plantar wart and pain due to an ingrown toenail  All other systems reviewed and are negative.     Objective:     BP 128/85 (BP Location: Right Arm, Patient Position: Sitting, Cuff Size: Large)   Pulse 90   Temp 98.6 F (37 C) (Oral)   Ht 5\' 2"  (1.575 m)   Wt 285 lb 6.4 oz (129.5 kg)   SpO2 99% Comment: RA  BMI 52.20 kg/m  BP Readings from Last 3 Encounters:  01/03/22 128/85  12/02/21 (!) 146/87  11/27/21 133/82   Wt Readings from Last 3 Encounters:  01/03/22 285 lb 6.4 oz (129.5 kg)  12/02/21 286 lb 12.8 oz (130.1 kg)  11/27/21 285 lb (129.3 kg)      Physical Exam Constitutional:      General: She is not in acute distress.    Appearance: Normal appearance. She is obese. She is not toxic-appearing.  HENT:     Head: Normocephalic and atraumatic.  Eyes:     General:        Right eye: No discharge.        Left eye: No discharge.     Conjunctiva/sclera: Conjunctivae normal.  Pulmonary:     Effort: Pulmonary effort is  normal. No respiratory distress.  Musculoskeletal:        General: No swelling or tenderness.  Skin:    Findings: Lesion (She has painful plantar wart on the left lateral plantar surface, midway from digits to heel) present. No rash.     Comments: She has an ingrown first toenail on the lateral bed, she has painful and swollen skin surrounding this area.   Neurological:     Mental Status: She is alert and oriented to person, place, and time. Mental status is at baseline.     Motor: No weakness.  Psychiatric:        Mood and Affect: Mood normal.        Behavior: Behavior normal.     A1C and BMET done today.     Assessment & Plan:   Problem List Items Addressed This Visit       Unprioritized   Chronic heart failure with preserved ejection fraction (HCC) (Chronic)    No acute issues  today.  She is taking lasix only PRN.  HCTZ in the benicar is helping with her swelling.  No chest pain  Monitor, consider repeat TTE in the next 1-2 years.       Morbid obesity with BMI of 50.0-59.9, adult (HCC) - Primary (Chronic)   Relevant Medications   Dulaglutide (TRULICITY) 1.5 MG/0.5ML SOPN   Resistant hypertension (Chronic)    BP is well controlled today at 128/85 which I congratulated her on.  She is taking benicar and spironolactone with good results.  She also feels like her swelling is better on this combination.    Plan Continue benicar and spironolactone - BMET today      Relevant Orders   BMP8+Anion Gap   Preventative health care    We discussed continuing FIT testing, which she would prefer.  She had this done in March, negative.  Will order in 2024.  She is due for mammogram, encouraged her to schedule.       OSA (obstructive sleep apnea)    She needs a new sleep study, I encouraged her to schedule.       Prediabetes    Check A1C today.  She would like to go up on trulicity for weight loss.   Plan Increase trulicity to 1.5mg /week Nausea PRN medication zofran ordered Prilosec for heartburn      Relevant Medications   Dulaglutide (TRULICITY) 1.5 MG/0.5ML SOPN   Other Relevant Orders   POC Hbg A1C (Completed)   CKD (chronic kidney disease) stage 3, GFR 30-59 ml/min (HCC)    She notes no change in urination, no back pain.   Plan Continue good control of glucose and BP Check BMET today.       Relevant Orders   BMP8+Anion Gap   Plantar wart of left foot    She has had cryotherapy X 2.  We will try 3 months of salicylic acid.  Gave recommendations on how to use.  Ordered the lotion for her to use.    If no improvement at 3 months, will try another dose of cyrotherapy.       Relevant Medications   Salicylic Acid (SALICYLIC ACID WART REMOVER) 27.5 % LIQD   Ingrown toenail of left foot    Schedule for removal - procedure visit in 2 weeks.        Other Visit Diagnoses     Gastroesophageal reflux disease without esophagitis       Relevant Medications   omeprazole (PRILOSEC) 20 MG  capsule   ondansetron (ZOFRAN) 4 MG tablet       Return in about 3 months (around 04/04/2022).    Gilles Chiquito, MD

## 2022-01-03 NOTE — Assessment & Plan Note (Signed)
No acute issues today.  She is taking lasix only PRN.  HCTZ in the benicar is helping with her swelling.  No chest pain  Monitor, consider repeat TTE in the next 1-2 years.

## 2022-01-03 NOTE — Assessment & Plan Note (Signed)
She notes no change in urination, no back pain.   Plan Continue good control of glucose and BP Check BMET today.

## 2022-01-03 NOTE — Patient Instructions (Addendum)
Maria Greene - -  Thank you for coming in today!  For your trulicity, please increase to the 1.5mg /weekly.  I have sent in this prescription for you.  Your nausea and heartburn may worsen during this time, I have sent in prilosec and zofran for you.   Please come back to see me in 3 months.   Your stool testing for colon cancer is adequate, you do not need a colonoscopy currently.    Thank you!  For your wart - please apply salicylic acid daily or every other day.   Salicylic acid is applied directly to the wart. Skin should be dry prior to application. Application is repeated daily. Duct tape or other tape is useful for securely attaching salicylic acid pads and occluding salicylic acid ointment on the skin. If tape is used, the tape and salicylic acid can be replaced every 48 hours.   Paring (taking off the top layer of skin) of the wart should be repeated periodically to minimize build-up of thick skin. Soaking the wart for five minutes can facilitate paring and removal of thickened skin.

## 2022-01-03 NOTE — Assessment & Plan Note (Signed)
We discussed continuing FIT testing, which she would prefer.  She had this done in March, negative.  Will order in 2024.  She is due for mammogram, encouraged her to schedule.

## 2022-01-03 NOTE — Assessment & Plan Note (Signed)
She has had cryotherapy X 2.  We will try 3 months of salicylic acid.  Gave recommendations on how to use.  Ordered the lotion for her to use.    If no improvement at 3 months, will try another dose of cyrotherapy.

## 2022-01-03 NOTE — Assessment & Plan Note (Signed)
Check A1C today.  She would like to go up on trulicity for weight loss.   Plan Increase trulicity to 1.5mg /week Nausea PRN medication zofran ordered Prilosec for heartburn

## 2022-01-03 NOTE — Assessment & Plan Note (Signed)
She needs a new sleep study, I encouraged her to schedule.

## 2022-01-03 NOTE — Assessment & Plan Note (Signed)
Schedule for removal - procedure visit in 2 weeks.

## 2022-01-05 LAB — BMP8+ANION GAP
Anion Gap: 17 mmol/L (ref 10.0–18.0)
BUN/Creatinine Ratio: 11 (ref 9–23)
BUN: 16 mg/dL (ref 6–24)
CO2: 23 mmol/L (ref 20–29)
Calcium: 9.4 mg/dL (ref 8.7–10.2)
Chloride: 98 mmol/L (ref 96–106)
Creatinine, Ser: 1.5 mg/dL — ABNORMAL HIGH (ref 0.57–1.00)
Glucose: 91 mg/dL (ref 70–99)
Potassium: 4.5 mmol/L (ref 3.5–5.2)
Sodium: 138 mmol/L (ref 134–144)
eGFR: 40 mL/min/{1.73_m2} — ABNORMAL LOW (ref >59)

## 2022-01-06 ENCOUNTER — Telehealth: Payer: Self-pay

## 2022-01-06 NOTE — Telephone Encounter (Signed)
Pt stated she did not receive cream for the wart on her foot. Informed pt rx was sent [facility] for salicylic acid. Stated she will call the pharmacy and call back if she has any problems.

## 2022-01-06 NOTE — Telephone Encounter (Signed)
Requesting to speak with a nurse about getting medication for wart under the feet. Please call pt back.

## 2022-01-15 ENCOUNTER — Encounter: Payer: Self-pay | Admitting: Internal Medicine

## 2022-01-23 ENCOUNTER — Ambulatory Visit: Payer: Commercial Managed Care - HMO | Admitting: Internal Medicine

## 2022-01-23 ENCOUNTER — Encounter: Payer: Self-pay | Admitting: Internal Medicine

## 2022-01-23 VITALS — BP 137/89 | HR 89 | Temp 98.3°F | Ht 62.0 in | Wt 282.1 lb

## 2022-01-23 DIAGNOSIS — B351 Tinea unguium: Secondary | ICD-10-CM

## 2022-01-23 DIAGNOSIS — B070 Plantar wart: Secondary | ICD-10-CM

## 2022-01-23 DIAGNOSIS — L600 Ingrowing nail: Secondary | ICD-10-CM

## 2022-01-23 MED ORDER — CICLOPIROX 8 % EX SOLN: Freq: Every day | CUTANEOUS | 0 refills | Status: DC

## 2022-01-23 NOTE — Assessment & Plan Note (Signed)
L great toe nail appearance is concerning for onychomycosis. Plan:Ciclopirox solution sent in for treatment of L great toe nail onychomycosis. Patient given instructions on its use.

## 2022-01-23 NOTE — Patient Instructions (Signed)
Maria Greene,  It was a pleasure to care for you today.  We froze the wart on your foot today. Because the toe nail that is ingrown is so thick, we will have you see a podiatrist to help with this removal. I have sent in a medication for you to put on this big toe nail every day called ciclopirox. You will apply this over the nail and surrounding skin, including the end of the nail, every day for 7 days. On day 7, you can use an alcohol swab or rubbing alcohol to clean the nail and begin this process again.  My best, Dr. Marlou Sa

## 2022-01-23 NOTE — Assessment & Plan Note (Signed)
Patient attempted to pick up prescription for salicylic acid for her L plantar foot wart however she explains that when she went to pick up the prescription, she was told that it was an over the counter medication. For that reason she did not pick up this medication. The L plantar wart persists at this time.  PRE-OP DIAGNOSIS: Plantar wart, L foot POST-OP DIAGNOSIS: Plantar wart, L foot  PROCEDURE: Cryotherapy Performing Physician: Dr. Marlou Sa Supervising Physician (if applicable):Dr. Daryll Drown PROCEDURE:  Cryotherapy  The patient tolerated the procedure well without complications. Standard post-procedure care is explained and return precautions are given.  Plan:Repeat cryotherapy performed on L plantar wart today. Patient advised to give the therapy 4 weeks and let us know if the wart persists at that time; if so, we will plan to trial 3 months of salicylic acid. Patient voices understanding.

## 2022-01-23 NOTE — Assessment & Plan Note (Signed)
Patient presents today for removal of L great toe nail due to it being ingrown. On exam prior to procedure, the toe nail is very thick and unfortunately it is not felt with certainty that we will have success in removal of the ingrown nail. The surrounding skin is exquisitely tender to light tough with mild edema on medial nail fold with a scab at distal medial nail fold, without increased warmth, erythema, or drainage. Plan:Referral to podiatry placed for removal of ingrown toe nail.

## 2022-01-23 NOTE — Progress Notes (Signed)
   CC: persistent L plantar foot wart and L great toe ingrown toenail  HPI:  [name] is a 61 y.o. person with past medical history as detailed below who presents today for removal of L great toe ingrown toe nail and persistent L plantar foot wart. Please see problem based charting for detailed assessment and plan.  Past Medical History:  Diagnosis Date   CHF (congestive heart failure) ([place])    Chronic heart failure with preserved ejection fraction ([place])    Dyspnea 03/29/2018   Essential hypertension 04/06/2018   Hypertensive urgency 03/29/2018   Mixed hyperlipidemia    OSA (obstructive sleep apnea)    Review of Systems:  Negative unless otherwise stated.  Physical Exam:  Vitals:   01/23/22 0952  BP: 137/89  Pulse: 89  Temp: 98.3 F (36.8 C)  TempSrc: Oral  SpO2: 96%  Weight: 282 lb 1.6 oz (128 kg)  Height: 5\' 2"  (1.575 m)   Constitutional:Patient appears well, is in no acute distress. SWH:[id number] for extremity edema. L great toe nail is thick with some flaking of the distal nail. Medial nail bed with edema and exquisite tenderness to touch without warmth, erythema, or drainage. There is a dark scab at the distal portion of the medial nail fold. Skin:Warm and dry. Neuro:Alert and oriented x3. No focal deficit noted. Psych:Pleasant mood and affect.  Assessment & Plan:   See Encounters Tab for problem based charting.  Onychomycosis L great toe nail appearance is concerning for onychomycosis. Plan:Ciclopirox solution sent in for treatment of L great toe nail onychomycosis. Patient given instructions on its use.  Ingrown toenail of left foot Patient presents today for removal of L great toe nail due to it being ingrown. On exam prior to procedure, the toe nail is very thick and unfortunately it is not felt with certainty that we will have success in removal of the ingrown nail. The surrounding skin is exquisitely tender to light tough with mild edema on medial nail  fold with a scab at distal medial nail fold, without increased warmth, erythema, or drainage. Plan:Referral to podiatry placed for removal of ingrown toe nail.  Plantar wart of left foot Patient attempted to pick up prescription for salicylic acid for her L plantar foot wart however she explains that when she went to pick up the prescription, she was told that it was an over the counter medication. For that reason she did not pick up this medication. The L plantar wart persists at this time.   PRE-OP DIAGNOSIS: Plantar wart, L foot POST-OP DIAGNOSIS: Plantar wart, L foot  PROCEDURE: Cryotherapy Performing Physician: Dr. Marlou Sa Supervising Physician (if applicable):Dr. Daryll Drown PROCEDURE:  Cryotherapy  The patient tolerated the procedure well without complications. Standard post-procedure care is explained and return precautions are given.  Plan:Repeat cryotherapy performed on L plantar wart today. Patient advised to give the therapy 4 weeks and let us know if the wart persists at that time; if so, we will plan to trial 3 months of salicylic acid. Patient voices understanding.  Patient seen with Dr. Daryll Drown

## 2022-01-31 NOTE — Addendum Note (Signed)
Addended by: Gilles Chiquito B on: 01/31/2022 11:06 AM   Modules accepted: Level of Service

## 2022-01-31 NOTE — Progress Notes (Signed)
Internal Medicine Clinic Attending  Case discussed with Dr. Dean  at the time of the visit.  We reviewed the resident's history and exam and pertinent patient test results.  I agree with the assessment, diagnosis, and plan of care documented in the resident's note.  

## 2022-02-04 ENCOUNTER — Ambulatory Visit: Payer: Commercial Managed Care - HMO | Admitting: Podiatry

## 2022-02-19 ENCOUNTER — Telehealth: Payer: Self-pay | Admitting: *Deleted

## 2022-02-19 NOTE — Telephone Encounter (Signed)
Call from pt stating she's unable to get Trulicity 1.5 mg and  [facility] told her to call the office. Stated she has been doubling up on 0.75 mg but now she's out. I called [facility]  who stated pt needs to call her insurance co ; pt was given 0.75 mg on 10/21 and it's soon for a refill. Pt was called / informed of [facility]' response. Pt stated she might continue the 0.75 mg since there are refills. Advised pt to call her insurance co as suggested by the pharmacy.

## 2022-03-05 ENCOUNTER — Encounter: Payer: Self-pay | Admitting: Podiatry

## 2022-03-05 ENCOUNTER — Ambulatory Visit: Payer: Commercial Managed Care - HMO | Admitting: Podiatry

## 2022-03-05 DIAGNOSIS — Q828 Other specified congenital malformations of skin: Secondary | ICD-10-CM

## 2022-03-05 DIAGNOSIS — B351 Tinea unguium: Secondary | ICD-10-CM | POA: Diagnosis not present

## 2022-03-05 DIAGNOSIS — L600 Ingrowing nail: Secondary | ICD-10-CM

## 2022-03-05 NOTE — Patient Instructions (Signed)

## 2022-03-05 NOTE — Progress Notes (Signed)
Subjective:   Patient ID: Maria Greene, female   DOB: 61 y.o.   MRN: [id number]   HPI Patient presents with significant pain in the big toe left foot lateral side is very painful when pressed with incurvation of the borders no current active drainage or redness noted.  Patient does not smoke she does like to be active and also has some discoloration of nailbeds   Review of Systems  All other systems reviewed and are negative.       Objective:  Physical Exam Vitals and nursing note reviewed.  Constitutional:      Appearance: She is well-developed.  Pulmonary:     Effort: Pulmonary effort is normal.  Musculoskeletal:        General: Normal range of motion.  Skin:    General: Skin is warm.  Neurological:     Mental Status: She is alert.     Neurovascular status intact muscle strength found to be adequate range of motion within normal limits with patient found to have incurvated lateral border of the left hallux painful when pressed slight discoloration nailbeds and dry tight skin formation.  Patient has good digital perfusion well-oriented x3     Assessment:  Significant ingrown toenail deformity left hallux lateral border with pain along with skin that has moderate dryness and mild fungus of nailbeds      Plan:  H&P discussed all 3 conditions and went ahead today were focusing on the ingrown toenail and I did discuss correction explained procedure risk patient wants surgery.  I infiltrated the left hallux 60 mg like Marcaine mixture sterile prep done using sterile instrumentation remove the lateral border exposed matrix applied phenol 3 applications 30 seconds followed by alcohol lavage sterile dressing gave instructions on soaks and to wear dressing 24 hours take it off earlier if throbbing were to occur.  Reappoint to recheck as needed and encouraged to call with questions

## 2022-04-04 ENCOUNTER — Ambulatory Visit: Payer: Commercial Managed Care - HMO | Admitting: Surgical

## 2022-04-04 ENCOUNTER — Encounter: Payer: Self-pay | Admitting: Surgical

## 2022-04-04 ENCOUNTER — Encounter: Payer: Commercial Managed Care - HMO | Admitting: Internal Medicine

## 2022-04-04 DIAGNOSIS — M1711 Unilateral primary osteoarthritis, right knee: Secondary | ICD-10-CM | POA: Diagnosis not present

## 2022-04-04 DIAGNOSIS — M1712 Unilateral primary osteoarthritis, left knee: Secondary | ICD-10-CM

## 2022-04-04 MED ORDER — BUPIVACAINE HCL 0.25 % IJ SOLN
4.0000 mL | INTRAMUSCULAR | Status: AC | PRN
  Administered 2022-04-04: 4 mL via INTRA_ARTICULAR

## 2022-04-04 MED ORDER — LIDOCAINE HCL 1 % IJ SOLN
5.0000 mL | INTRAMUSCULAR | Status: AC | PRN
  Administered 2022-04-04: 5 mL

## 2022-04-04 MED ORDER — METHYLPREDNISOLONE ACETATE 40 MG/ML IJ SUSP
40.0000 mg | INTRAMUSCULAR | Status: AC | PRN
  Administered 2022-04-04: 40 mg via INTRA_ARTICULAR

## 2022-04-04 MED ORDER — LIDOCAINE 4 % EX PTCH: 1.0000 | MEDICATED_PATCH | CUTANEOUS | 0 refills | Status: AC

## 2022-04-04 NOTE — Progress Notes (Signed)
Office Visit Note   Patient: Maria Greene           Date of Birth: May 04, 1960           MRN: [id number] Visit Date: 04/04/2022 Requested by: Inez Catalina, MD [address] [place],  [place] [address] PCP: Inez Catalina, MD  Subjective: Chief Complaint  Patient presents with   Right Knee - Pain   Left Knee - Pain    HPI: Maria Greene is a 61 y.o. female who presents to the office reporting bilateral knee pain.  Patient has history of bilateral knee osteoarthritis.  She has limited walking endurance.  She had previous injections back in March 2023 that provided decent relief for a short period of time.  She would like to repeat these today.  She has been working on weight loss with her primary doctor and has lost about 20 pounds with goal weight of around 215 pounds in order to achieve BMI necessary for knee replacement.  No groin pain or radicular pain.  Vast majority of her pain is in the medial aspect of both knees.  Left knee bothers her more than the right..                ROS: All systems reviewed are negative as they relate to the chief complaint within the history of present illness.  Patient denies fevers or chills.  Assessment & Plan: Visit Diagnoses:  1. Unilateral primary osteoarthritis, left knee   2. Unilateral primary osteoarthritis, right knee     Plan: Patient is a 61 year old female who presents for evaluation of bilateral knee osteoarthritis.  Previous injections provided decent relief for her to the point where she would like to repeat these today.  She denies any significant uncontrolled diabetes, stating she is prediabetic.  Bilateral knee cortisone injection successfully delivered and patient tolerated procedure well.  Follow-up with the office as needed if she would like to repeat these injections in 3 to 4 months or if she would like to pursue knee replacement once she achieves her goal weight.  Follow-Up Instructions: No follow-ups on file.   Orders:  No  orders of the defined types were placed in this encounter.  No orders of the defined types were placed in this encounter.     Procedures: Large Joint Inj: bilateral knee on 04/04/2022 3:18 PM Indications: diagnostic evaluation, joint swelling and pain Details: 18 G 1.5 in needle, superolateral approach  Arthrogram: No  Medications (Right): 5 mL lidocaine 1 %; 4 mL bupivacaine 0.25 %; 40 mg methylPREDNISolone acetate 40 MG/ML Medications (Left): 5 mL lidocaine 1 %; 4 mL bupivacaine 0.25 %; 40 mg methylPREDNISolone acetate 40 MG/ML Outcome: tolerated well, no immediate complications Procedure, treatment alternatives, risks and benefits explained, specific risks discussed. Consent was given by the patient. Immediately prior to procedure a time out was called to verify the correct patient, procedure, equipment, support staff and site/side marked as required. Patient was prepped and draped in the usual sterile fashion.       Clinical Data: No additional findings.  Objective: Vital Signs: There were no vitals taken for this visit.  Physical Exam:  Constitutional: Patient appears well-developed HEENT:  Head: Normocephalic Eyes:EOM are normal Neck: Normal range of motion Cardiovascular: Normal rate Pulmonary/chest: Effort normal Neurologic: Patient is alert Skin: Skin is warm Psychiatric: Patient has normal mood and affect  Ortho Exam: Ortho exam demonstrates bilateral knees with trace effusion.  Varus alignment noted in both knees.  Flexion contracture about 5 degrees bilaterally.  Tenderness over the medial joint line moderately and lateral joint line mildly.  Able to perform straight leg raise with both legs.  No pain with hip range of motion.  No cellulitis or skin changes noted.  Specialty Comments:  No specialty comments available.  Imaging: No results found.   PMFS History: Patient Active Problem List   Diagnosis Date Noted   Onychomycosis 01/23/2022   Ingrown  toenail of left foot 01/03/2022   CKD (chronic kidney disease) stage 3, GFR 30-59 ml/min (HCC) 10/31/2021   Plantar wart of left foot 10/31/2021   Prediabetes 08/09/2021   OSA (obstructive sleep apnea) 10/11/2019   Preventative health care 05/10/2019   Normocytic anemia 05/10/2019   Resistant hypertension 03/08/2019   Morbid obesity with BMI of 50.0-59.9, adult (HCC) 02/15/2019   Bilateral chronic knee pain 02/15/2019   Chronic heart failure with preserved ejection fraction (HCC) 04/06/2018   Past Medical History:  Diagnosis Date   CHF (congestive heart failure) (HCC)    Chronic heart failure with preserved ejection fraction (HCC)    Dyspnea 03/29/2018   Essential hypertension 04/06/2018   Hypertensive urgency 03/29/2018   Mixed hyperlipidemia    OSA (obstructive sleep apnea)     Family History  Problem Relation Age of Onset   Prostate cancer Brother    Cancer - Other Brother        Blood    Past Surgical History:  Procedure Laterality Date   ABDOMINAL HYSTERECTOMY  1998   CHOLECYSTECTOMY     KNEE ARTHROSCOPY     TUBAL LIGATION     Social History   Occupational History   Not on file  Tobacco Use   Smoking status: Never   Smokeless tobacco: Never  Vaping Use   Vaping Use: Never used  Substance and Sexual Activity   Alcohol use: No   Drug use: No   Sexual activity: Not on file

## 2022-05-19 ENCOUNTER — Encounter: Payer: Commercial Managed Care - HMO | Admitting: Internal Medicine

## 2022-05-30 ENCOUNTER — Ambulatory Visit: Payer: Commercial Managed Care - HMO | Admitting: Internal Medicine

## 2022-05-30 ENCOUNTER — Other Ambulatory Visit: Payer: Self-pay

## 2022-05-30 ENCOUNTER — Encounter: Payer: Self-pay | Admitting: Internal Medicine

## 2022-05-30 VITALS — BP 119/74 | HR 90 | Temp 98.0°F | Ht 62.0 in | Wt 272.2 lb

## 2022-05-30 DIAGNOSIS — Z6841 Body Mass Index (BMI) 40.0 and over, adult: Secondary | ICD-10-CM

## 2022-05-30 DIAGNOSIS — R7303 Prediabetes: Secondary | ICD-10-CM | POA: Diagnosis not present

## 2022-05-30 DIAGNOSIS — I1A0 Resistant hypertension: Secondary | ICD-10-CM | POA: Diagnosis not present

## 2022-05-30 DIAGNOSIS — M25561 Pain in right knee: Secondary | ICD-10-CM

## 2022-05-30 DIAGNOSIS — K5901 Slow transit constipation: Secondary | ICD-10-CM | POA: Insufficient documentation

## 2022-05-30 DIAGNOSIS — B351 Tinea unguium: Secondary | ICD-10-CM

## 2022-05-30 DIAGNOSIS — M25562 Pain in left knee: Secondary | ICD-10-CM

## 2022-05-30 DIAGNOSIS — I5032 Chronic diastolic (congestive) heart failure: Secondary | ICD-10-CM | POA: Diagnosis not present

## 2022-05-30 DIAGNOSIS — N1831 Chronic kidney disease, stage 3a: Secondary | ICD-10-CM

## 2022-05-30 DIAGNOSIS — G8929 Other chronic pain: Secondary | ICD-10-CM

## 2022-05-30 LAB — POCT GLYCOSYLATED HEMOGLOBIN (HGB A1C): Hemoglobin A1C: 5.7 % — AB (ref 4.0–5.6)

## 2022-05-30 LAB — GLUCOSE, CAPILLARY: Glucose-Capillary: 75 mg/dL (ref 70–99)

## 2022-05-30 MED ORDER — PEN NEEDLES 31G X 5 MM MISC: 1.0000 [IU] | 0 refills | Status: DC

## 2022-05-30 MED ORDER — TRULICITY 1.5 MG/0.5ML ~~LOC~~ SOAJ: 1.5000 mg | SUBCUTANEOUS | 3 refills | Status: DC

## 2022-05-30 NOTE — Assessment & Plan Note (Signed)
No change in her swelling today.  She has better controlled blood pressure and pre-diabetes.  She has lost about 10 pounds which I congratulated her on.

## 2022-05-30 NOTE — Patient Instructions (Signed)
Maria Greene - -  Thank you for coming in today!  Great news!  Your A1C is 5.7, which is the lowest number in pre-diabetes.  Great work!  You can continue your motrin as you are taking it.  We will check your kidney function today and if still the same, it should be fine to continue taking.    Please schedule your mammogram when you are able.   Please come back to see me in 3-4 months.

## 2022-05-30 NOTE — Assessment & Plan Note (Signed)
BP is much improved today!  She has lost weight as well.  She iis on olmesartan, spironolactone and hctz with PRN lasix for swelling.  Her BP is 119/74.  She has CKD as well, so we will be checking a BMET today.   Plan Continue triple therapy Check BMET today.

## 2022-05-30 NOTE — Progress Notes (Unsigned)
Established Patient Office Visit  Subjective   Patient ID: Maria Greene, female    DOB: 12/11/1960  Age: 62 y.o. MRN: [id number]  Chief Complaint  Patient presents with   Follow-up    Pre-diabetes. Knees pain.    Ms. [name] comes in today for follow up of her pre-diabetes.  We discussed her excellent work with losing 10 pounds and her A1C is now 5.7!  She will remain on trulicity and refills were sent.   For her knee pain, she had injections in December and her pain is just starting to come back.  She will plan to have knee injections quarterly.  She is taking motrin, so we discussed her CKD today and the effects of NSAIDs.  We will get a BMET today which will better inform a decision of whether to be on NSAIDs still (using thrice weekly) or change to something like tramadol.   She is having numbness at times when she wakes up in an ulnar distribution.  We discussed ulnar neuropathy and she is going to try options to keep her arms straighter at night.     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  Genitourinary:  Negative for dysuria.  Musculoskeletal:  Positive for joint pain. Negative for falls.  Neurological:  Negative for dizziness and weakness.      Objective:     BP 119/74 (BP Location:  Right Arm, Patient Position: Sitting, Cuff Size: Large)   Pulse 90   Temp 98 F (36.7 C) (Oral)   Ht 5' 2"$  (1.575 m)   Wt 272 lb 3.2 oz (123.5 kg)   SpO2 100% Comment: RA  BMI 49.79 kg/m  BP Readings from Last 3 Encounters:  05/30/22 119/74  01/23/22 137/89  01/03/22 128/85   Wt Readings from Last 3 Encounters:  05/30/22 272 lb 3.2 oz (123.5 kg)  01/23/22 282 lb 1.6 oz (128 kg)  01/03/22 285 lb 6.4 oz (129.5 kg)      Physical Exam Vitals and nursing note reviewed.  Constitutional:      General: She is not in acute distress.    Appearance: Normal appearance. She is obese. She is not toxic-appearing.  HENT:     Head: Normocephalic and atraumatic.  Cardiovascular:     Rate and Rhythm: Normal rate.  Pulmonary:     Effort: Pulmonary effort is normal. No respiratory distress.  Musculoskeletal:        General: Tenderness (She has some mild tenderness of the left great toe at the site of previous ingrown toenail removal, thought this is improving) present. No swelling, deformity or signs of injury.  Skin:    General: Skin is warm and dry.     Coloration: Skin is not jaundiced or pale.     Findings: No rash.  Neurological:  General: No focal deficit present.     Mental Status: She is alert. Mental status is at baseline.  Psychiatric:        Mood and Affect: Mood normal.        Behavior: Behavior normal.      Results for orders placed or performed in visit on 05/30/22  Glucose, capillary  Result Value Ref Range   Glucose-Capillary 75 70 - 99 mg/dL  POC Hbg A1C  Result Value Ref Range   Hemoglobin A1C 5.7 (A) 4.0 - 5.6 %   HbA1c POC (<> result, manual entry)     HbA1c, POC (prediabetic range)     HbA1c, POC (controlled diabetic range)     BMET today Mammogram due, encouraged to schedule    Assessment & Plan:   Problem List Items Addressed This Visit       Unprioritized   Chronic heart failure with preserved ejection fraction (HCC) (Chronic)    No change  in her swelling today.  She has better controlled blood pressure and pre-diabetes.  She has lost about 10 pounds which I congratulated her on.       Morbid obesity with BMI of 50.0-59.9, adult (HCC) (Chronic)   Relevant Medications   Dulaglutide (TRULICITY) 1.5 0000000 SOPN   Insulin Pen Needle (PEN NEEDLES) 31G X 5 MM MISC   Resistant hypertension (Chronic)    BP is much improved today!  She has lost weight as well.  She iis on olmesartan, spironolactone and hctz with PRN lasix for swelling.  Her BP is 119/74.  She has CKD as well, so we will be checking a BMET today.   Plan Continue triple therapy Check BMET today.       Prediabetes - Primary   Relevant Medications   Dulaglutide (TRULICITY) 1.5 0000000 SOPN   Insulin Pen Needle (PEN NEEDLES) 31G X 5 MM MISC   Other Relevant Orders   POC Hbg A1C (Completed)   BMP8+Anion Gap   CKD (chronic kidney disease) stage 3, GFR 30-59 ml/min (HCC)   Relevant Orders   BMP8+Anion Gap   Constipation by delayed colonic transit    Return in about 3 months (around 08/28/2022).    Maria Chiquito, MD

## 2022-05-31 LAB — BMP8+ANION GAP
Anion Gap: 17 mmol/L (ref 10.0–18.0)
BUN/Creatinine Ratio: 15 (ref 9–23)
BUN: 16 mg/dL (ref 6–24)
CO2: 20 mmol/L (ref 20–29)
Calcium: 9.4 mg/dL (ref 8.7–10.2)
Chloride: 102 mmol/L (ref 96–106)
Creatinine, Ser: 1.1 mg/dL — ABNORMAL HIGH (ref 0.57–1.00)
Glucose: 82 mg/dL (ref 70–99)
Potassium: 4.4 mmol/L (ref 3.5–5.2)
Sodium: 139 mmol/L (ref 134–144)
eGFR: 58 mL/min/{1.73_m2} — ABNORMAL LOW (ref >59)

## 2022-06-02 NOTE — Assessment & Plan Note (Signed)
A1C much improved today at 5.7.  She has lost 10 pounds on dulaglutide.  We refilled her pen needles today.  Overall, she is much improved and I congratulated her on this success.

## 2022-06-02 NOTE — Assessment & Plan Note (Signed)
She notes only having a bowel movement about once per week.  This will cause significant pain and then a long delay in passing stool.  She has had no blood in the stool.  She normally has not gone every day, but this is excessive for her.    We discussed daily fiber and miralax.  She will try these to have a BM about every other day.  Further discussion if not improving on this regimen.

## 2022-06-02 NOTE — Assessment & Plan Note (Signed)
We discussed at length today.  I explained to her why we keep tabs on this issue with a history of DM and HTN.  She has been doing better with weight, blood pressure and pre-DM control.  We will check her BMET today.  She may benefit from jardiance in the future.  Will have her follow up in 3 months for repeat A1C.

## 2022-06-02 NOTE — Assessment & Plan Note (Signed)
She is doing well, notes that the issues with her toes are improving.  She has gotten an ingrown toenail removed at the podiatrist and still has some mild pain at the site, no swelling or other issues.

## 2022-06-02 NOTE — Assessment & Plan Note (Signed)
We discussed today.  She is getting knee injections quarterly and does not want surgery at this time.  She is on motrin, about 3X per week.  We discussed that this may cause issues with her kidneys, so we will check her kidney function today.

## 2022-07-09 ENCOUNTER — Ambulatory Visit: Payer: Commercial Managed Care - HMO | Admitting: Surgical

## 2022-07-09 ENCOUNTER — Encounter: Payer: Self-pay | Admitting: Surgical

## 2022-07-09 DIAGNOSIS — M1712 Unilateral primary osteoarthritis, left knee: Secondary | ICD-10-CM | POA: Diagnosis not present

## 2022-07-09 DIAGNOSIS — M1711 Unilateral primary osteoarthritis, right knee: Secondary | ICD-10-CM

## 2022-07-09 MED ORDER — METHYLPREDNISOLONE ACETATE 40 MG/ML IJ SUSP
40.0000 mg | INTRAMUSCULAR | Status: AC | PRN
  Administered 2022-07-09: 40 mg via INTRA_ARTICULAR

## 2022-07-09 MED ORDER — LIDOCAINE HCL 1 % IJ SOLN
5.0000 mL | INTRAMUSCULAR | Status: AC | PRN
  Administered 2022-07-09: 5 mL

## 2022-07-09 MED ORDER — BUPIVACAINE HCL 0.25 % IJ SOLN
4.0000 mL | INTRAMUSCULAR | Status: AC | PRN
  Administered 2022-07-09: 4 mL via INTRA_ARTICULAR

## 2022-07-09 NOTE — Progress Notes (Signed)
Office Visit Note   Patient: Maria Greene           Date of Birth: Sep 13, 1960           MRN: [id number] Visit Date: 07/09/2022 Requested by: Sid Falcon, MD [place],  [place] [address] PCP: Sid Falcon, MD  Subjective: Chief Complaint  Patient presents with   Left Knee - Pain   Right Knee - Pain    HPI: Maria Greene is a 62 y.o. female who presents to the office reporting bilateral knee pain.  She has history of bilateral knee osteoarthritis.  Still has knee pain that she mostly localizes to the anterior medial aspect of both knees.  She is working on weight loss with Trulicity though this is on backorder now.  This has been helpful for her as far as reducing her appetite.  She is down about 30 pounds.  She is working toward the eventual goal of knee replacement surgery.  She has no groin pain.  No new injury or any significant change to her knee pain.  Does have some mechanical symptoms such as popping in the left knee has not is common in the right knee..                ROS: All systems reviewed are negative as they relate to the chief complaint within the history of present illness.  Patient denies fevers or chills.  Assessment & Plan: Visit Diagnoses:  1. Unilateral primary osteoarthritis, left knee   2. Unilateral primary osteoarthritis, right knee     Plan: Patient is a 62 year old female who presents for evaluation of bilateral knee pain.  She has history of bilateral knee osteoarthritis that is quite severe, primarily in the medial compartment.  She would like to repeat knee injections today which have historically given her about 50% relief of her symptoms.  Gel injections in the past have given her really no relief.  Tolerated both injections well today.  She will follow-up with the office as needed with the soonest possible injections 3 to 4 months from now.  She is doing well with her weight loss journey.  Follow-Up Instructions: Return if symptoms worsen or  fail to improve.   Orders:  Orders Placed This Encounter  Procedures   Large Joint Inj: bilateral knee   No orders of the defined types were placed in this encounter.     Procedures: No procedures performed   Clinical Data: No additional findings.  Objective: Vital Signs: There were no vitals taken for this visit.  Physical Exam:  Constitutional: Patient appears well-developed HEENT:  Head: Normocephalic Eyes:EOM are normal Neck: Normal range of motion Cardiovascular: Normal rate Pulmonary/chest: Effort normal Neurologic: Patient is alert Skin: Skin is warm Psychiatric: Patient has normal mood and affect  Ortho Exam: Ortho exam demonstrates bilateral knees with 5 degrees extension and 110 degrees of knee flexion.  No calf tenderness.  Negative Homans' sign.  No pain with hip range of motion.  Able to perform straight leg raise with both legs without extensor lag.  She has no effusion in either knee.  No cellulitis or skin changes noted.  Specialty Comments:  No specialty comments available.  Imaging: No results found.   PMFS History: Patient Active Problem List   Diagnosis Date Noted   Constipation by delayed colonic transit 05/30/2022   Onychomycosis 01/23/2022   CKD (chronic kidney disease) stage 3, GFR 30-59 ml/min (HCC) 10/31/2021   Plantar wart of  left foot 10/31/2021   Prediabetes 08/09/2021   OSA (obstructive sleep apnea) 10/11/2019   Preventative health care 05/10/2019   Resistant hypertension 03/08/2019   Morbid obesity with BMI of 50.0-59.9, adult ([place]) 02/15/2019   Bilateral chronic knee pain 02/15/2019   Chronic heart failure with preserved ejection fraction ([place]) 04/06/2018   Past Medical History:  Diagnosis Date   CHF (congestive heart failure) ([place])    Chronic heart failure with preserved ejection fraction ([place])    Dyspnea 03/29/2018   Essential hypertension 04/06/2018   Hypertensive urgency 03/29/2018   Mixed hyperlipidemia    OSA  (obstructive sleep apnea)     Family History  Problem Relation Age of Onset   Prostate cancer Brother    Cancer - Other Brother        Blood    Past Surgical History:  Procedure Laterality Date   ABDOMINAL HYSTERECTOMY  1998   CHOLECYSTECTOMY     KNEE ARTHROSCOPY     TUBAL LIGATION     Social History   Occupational History   Not on file  Tobacco Use   Smoking status: Never   Smokeless tobacco: Never  Vaping Use   Vaping Use: Never used  Substance and Sexual Activity   Alcohol use: No   Drug use: No   Sexual activity: Not on file

## 2022-07-09 NOTE — Progress Notes (Signed)
   Procedure Note  Patient: Maria Greene             Date of Birth: 05/09/1960           MRN: [id number]             Visit Date: 07/09/2022  Procedures: Visit Diagnoses:  1. Unilateral primary osteoarthritis, left knee   2. Unilateral primary osteoarthritis, right knee     Large Joint Inj: bilateral knee on 07/09/2022 10:54 AM Indications: diagnostic evaluation, joint swelling and pain Details: 18 G 1.5 in needle, superolateral approach  Arthrogram: No  Medications (Right): 5 mL lidocaine 1 %; 4 mL bupivacaine 0.25 %; 40 mg methylPREDNISolone acetate 40 MG/ML Medications (Left): 5 mL lidocaine 1 %; 4 mL bupivacaine 0.25 %; 40 mg methylPREDNISolone acetate 40 MG/ML Outcome: tolerated well, no immediate complications  Patient returns for bilateral knee injections.  She has history of bilateral knee severe arthritis.  These injections provide about 50% relief of her symptoms.  She is working on weight loss for the goal of eventual knee replacement surgery.  She is down about 30 pounds.  Trulicity is helping her quite a bit.  Follow-up as needed for repeat injections with the soonest possible injections 3 to 4 months from now. Procedure, treatment alternatives, risks and benefits explained, specific risks discussed. Consent was given by the patient. Immediately prior to procedure a time out was called to verify the correct patient, procedure, equipment, support staff and site/side marked as required. Patient was prepped and draped in the usual sterile fashion.

## 2022-08-04 ENCOUNTER — Telehealth: Payer: Self-pay

## 2022-08-04 NOTE — Telephone Encounter (Signed)
Pt is requesting a call back .Marland Kitchen She stated sht since getting her knee injection she is still in pain she has been taken  OTC ibuprofen but it is not working either ... She stated that her PC did tell her that is it was not working she would send something in stronger

## 2022-08-05 NOTE — Telephone Encounter (Signed)
Placed call to patient. She had not called Ortho as it is $100 co-pay each time she goes. Explained this pain following procedure is not normal. Advised she call Ortho to be reevaluated. States she will call them now.

## 2022-08-05 NOTE — Telephone Encounter (Signed)
Returned call to patient. States when she received knee injections at Ortho on 07/09/22, procedure was very painful. She has had no relief in bilat knee pain since then. She is rating pain 20/10 at present. She was doing ok on 4 tabs of Motrin until a week ago. She took a leftover Ibuprofen 800 mg and it allowed her to rest. She is requesting Rx for ibuprofen 800 mg sent to [facility].

## 2022-08-05 NOTE — Telephone Encounter (Signed)
Has she called the orthopedic office. I worry that she is in that much (worse) pain after a knee injection she needs to notify the orthopedic doctor and potentially be reevaluated.

## 2022-08-21 ENCOUNTER — Telehealth: Payer: Self-pay | Admitting: Physician Assistant

## 2022-08-21 NOTE — Telephone Encounter (Signed)
She can be re-evaluated if she wants but most likely she is having continued pain due to the short-lived nature of cortisone injections for such significant arthritis. Gel injections have not helped her at all.  Can't take NSAIDs due to history of CHF so I think Tylenol and topical medications are her best bet.  If this is not helpful, then we can see her back to see if there may be anything else contributing to her knee pain

## 2022-08-21 NOTE — Telephone Encounter (Signed)
Patient advised she is still having pain with the knees even after the cortisone injections and wants to know what to do next. Asking if she can get something for pain or does she need to be reevaluated. Please advise

## 2022-08-22 NOTE — Telephone Encounter (Signed)
Patient wanted to make an appt with August Saucer, appt made

## 2022-08-27 ENCOUNTER — Ambulatory Visit: Payer: Commercial Managed Care - HMO | Admitting: Orthopedic Surgery

## 2022-09-03 ENCOUNTER — Encounter: Payer: Self-pay | Admitting: Orthopedic Surgery

## 2022-09-03 ENCOUNTER — Ambulatory Visit: Payer: Commercial Managed Care - HMO | Admitting: Orthopedic Surgery

## 2022-09-03 ENCOUNTER — Other Ambulatory Visit (INDEPENDENT_AMBULATORY_CARE_PROVIDER_SITE_OTHER): Payer: Commercial Managed Care - HMO

## 2022-09-03 DIAGNOSIS — M25562 Pain in left knee: Secondary | ICD-10-CM | POA: Diagnosis not present

## 2022-09-03 DIAGNOSIS — M1711 Unilateral primary osteoarthritis, right knee: Secondary | ICD-10-CM | POA: Diagnosis not present

## 2022-09-03 DIAGNOSIS — M1712 Unilateral primary osteoarthritis, left knee: Secondary | ICD-10-CM | POA: Diagnosis not present

## 2022-09-03 DIAGNOSIS — M25561 Pain in right knee: Secondary | ICD-10-CM

## 2022-09-03 MED ORDER — LIDOCAINE HCL 1 % IJ SOLN
5.0000 mL | INTRAMUSCULAR | Status: AC | PRN
Start: 2022-09-03 — End: 2022-09-03
  Administered 2022-09-03: 5 mL

## 2022-09-03 MED ORDER — BUPIVACAINE HCL 0.25 % IJ SOLN
4.0000 mL | INTRAMUSCULAR | Status: AC | PRN
Start: 2022-09-03 — End: 2022-09-03
  Administered 2022-09-03: 4 mL via INTRA_ARTICULAR

## 2022-09-03 MED ORDER — CELECOXIB 100 MG PO CAPS: 100.0000 mg | ORAL_CAPSULE | Freq: Two times a day (BID) | ORAL | 0 refills | Status: DC

## 2022-09-03 NOTE — Progress Notes (Signed)
Office Visit Note   Patient: Maria Greene           Date of Birth: 1960-11-22           MRN: [id number] Visit Date: 09/03/2022 Requested by: Inez Catalina, MD [address] [place],  [place] [address] PCP: Inez Catalina, MD  Subjective: Chief Complaint  Patient presents with   Right Knee - Pain   Left Knee - Pain    HPI: Maria Greene is a 62 y.o. female who presents to the office reporting .  Bilateral knee pain left worse than right.  Reports worsening pain.  Did have knee injections in December 2023 and March 2024.  March 20 24 injections gave her no relief.  Difficulty with ambulation.  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  She has lost 30 pounds since last office visit.  Needs to lose about 50-60 more pounds in order to get within the range of BMI for knee replacement surgery.              ROS: All systems reviewed are negative as they relate to the chief complaint within the history of present illness.  Patient denies fevers or chills.  Assessment & Plan: Visit Diagnoses:  1. Pain in both knees, unspecified chronicity     Plan: Impression is bilateral knee arthritis.  Plan is Celebrex prescription along with Toradol injected in the both knees today.  Will see how she does with those injections.  Her arthritis is severe so it is not too surprising that injections would not last very long.  Could consider 1 more cortisone injection prior to the end of the year but she is aiming to get one of the knees replaced in November.  Follow-up as needed.  Follow-Up Instructions: No follow-ups on file.   Orders:  Orders Placed This Encounter  Procedures   XR KNEE 3 VIEW RIGHT   XR Knee 1-2 Views Left   Meds ordered this encounter  Medications   celecoxib (CELEBREX) 100 MG capsule    Sig: Take 1 capsule (100 mg total) by mouth 2 (two) times daily.    Dispense:  60 capsule    Refill:  0      Procedures: Large Joint Inj: bilateral knee on 09/03/2022  4:44 PM Indications: diagnostic evaluation, joint swelling and pain Details: 18 G 1.5 in needle, superolateral approach  Arthrogram: No  Medications (Right): 5 mL lidocaine 1 %; 4 mL bupivacaine 0.25 % Medications (Left): 5 mL lidocaine 1 %; 4 mL bupivacaine 0.25 % Outcome: tolerated well, no immediate complications Procedure, treatment alternatives, risks and benefits explained, specific risks discussed. Consent was given by the patient. Immediately prior to procedure a time out was called to verify the correct patient, procedure, equipment, support staff and site/side marked as required. Patient was prepped and draped in the usual sterile fashion.     Toradol injected into each knee  Clinical Data: No additional findings.  Objective: Vital Signs: There were no vitals taken for this visit.  Physical Exam:  Constitutional: Patient appears well-developed HEENT:  Head: Normocephalic Eyes:EOM are normal Neck: Normal range of motion Cardiovascular: Normal rate Pulmonary/chest: Effort normal Neurologic: Patient is alert Skin: Skin is warm Psychiatric: Patient has normal mood and affect  Ortho Exam: Ortho exam demonstrates palpable pedal pulses.  Each knee lacks about 5 degrees of full extension.  Each knee does bend to about 90 degrees.  Collateral and cruciate ligaments are  stable.  Patellofemoral crepitus present more on the left than the right.  She has no real effusion in either knees.  No groin pain with internal or external rotation of either leg.  Specialty Comments:  No specialty comments available.  Imaging: XR Knee 1-2 Views Left  Result Date: 09/03/2022 AP lateral radiographs left knee reviewed.  Severe end-stage tricompartmental arthritis is present with some subluxation of the femur on the tibia with mild varus alignment no acute fracture.  XR KNEE 3 VIEW RIGHT  Result Date: 09/03/2022 AP lateral merchant radiographs right knee reviewed.  Severe end-stage  tricompartmental arthritis is present with subluxation of the femur on the tibia by several millimeters along with bone-on-bone changes in all 3 compartments.  No acute fracture.    PMFS History: Patient Active Problem List   Diagnosis Date Noted   Constipation by delayed colonic transit 05/30/2022   Onychomycosis 01/23/2022   CKD (chronic kidney disease) stage 3, GFR 30-59 ml/min (HCC) 10/31/2021   Plantar wart of left foot 10/31/2021   Prediabetes 08/09/2021   OSA (obstructive sleep apnea) 10/11/2019   Preventative health care 05/10/2019   Resistant hypertension 03/08/2019   Morbid obesity with BMI of 50.0-59.9, adult (HCC) 02/15/2019   Bilateral chronic knee pain 02/15/2019   Chronic heart failure with preserved ejection fraction (HCC) 04/06/2018   Past Medical History:  Diagnosis Date   CHF (congestive heart failure) (HCC)    Chronic heart failure with preserved ejection fraction (HCC)    Dyspnea 03/29/2018   Essential hypertension 04/06/2018   Hypertensive urgency 03/29/2018   Mixed hyperlipidemia    OSA (obstructive sleep apnea)     Family History  Problem Relation Age of Onset   Prostate cancer Brother    Cancer - Other Brother        Blood    Past Surgical History:  Procedure Laterality Date   ABDOMINAL HYSTERECTOMY  1998   CHOLECYSTECTOMY     KNEE ARTHROSCOPY     TUBAL LIGATION     Social History   Occupational History   Not on file  Tobacco Use   Smoking status: Never   Smokeless tobacco: Never  Vaping Use   Vaping Use: Never used  Substance and Sexual Activity   Alcohol use: No   Drug use: No   Sexual activity: Not on file

## 2022-09-18 ENCOUNTER — Telehealth: Payer: Self-pay

## 2022-09-18 NOTE — Telephone Encounter (Signed)
Requesting to speak with a nurse about getting pain medication. Please call pt back.  

## 2022-09-18 NOTE — Telephone Encounter (Addendum)
Returned call to patient. States she's still having a lot of knee pain. Saw ortho on 5/15 and received knee injections which did not help. She was unaware of Rx for Celebrex. She will pick that up today.   She is still unable to locate Trulicity at different pharmacies. She was given 3 month f/u tomorrow with PCP.

## 2022-09-19 ENCOUNTER — Encounter: Payer: Commercial Managed Care - HMO | Admitting: Internal Medicine

## 2022-10-25 ENCOUNTER — Other Ambulatory Visit: Payer: Self-pay | Admitting: Orthopedic Surgery

## 2022-11-07 ENCOUNTER — Other Ambulatory Visit: Payer: Self-pay | Admitting: Internal Medicine

## 2022-11-07 DIAGNOSIS — I10 Essential (primary) hypertension: Secondary | ICD-10-CM

## 2022-11-07 DIAGNOSIS — I1A0 Resistant hypertension: Secondary | ICD-10-CM

## 2022-11-17 ENCOUNTER — Telehealth: Payer: Self-pay

## 2022-11-17 NOTE — Telephone Encounter (Signed)
Prior Authorization for patient (Trulicity) came through on cover my meds was submitted with last office notes awaiting approval or denial.  KGM:[id number]

## 2022-11-18 NOTE — Telephone Encounter (Signed)
Decision:Denied I have reviewed the request to cover Trulicity 1.5 mg/0.5 PEN INJCTR. The information submitted  did not meet the criteria necessary to approve this medication. Based on the information provided, I am unable to approve coverage for this medication because: There is no indication your patient has type 2 diabetes mellitus. [employer] does not cover Bydureon,  Byetta or Trulicity for any other indication because it is only approved for type 2 diabetes mellitus. Bydureon, Byetta and Trulicity are medically necessary when all of the following criteria are met (1  and 2): 1. Diagnosis of type 2 diabetes mellitus; and 2. Both of the following are met (A and B): A.  Documented one of the following: i. Unable to achieve goal HbA1C despite metformin or  metformin-containing regimen (meglitinides, sulfonylureas, or thiazolidinediones) at greater than or  equal to 1,500 mg per day; ii. Intolerance to metformin 1,500 mg per day despite appropriate dose  titration duration (for example, period of 8-12 weeks); iii. Contraindication to metformin per FDA  label (for example, acute/chronic metabolic acidosis, severe renal dysfunction); iv. Not a candidate  for metformin (for example, hepatic impairment, moderate renal dysfunction, unstable heart failure,  individual is using an agent for a non-diabetic FDA-approved indication); v. Initial metformin  [employer] on behalf of [address]  [place], [employer]. combination therapy is clinically appropriate for elevated HbA1C (for example; HbA1C greater than  1.5% above goal); or vi. Initial metformin combination therapy is clinically appropriate in an  individual with co-morbid conditions (such as ASCVD, heart failure, or CKD); and B. Individual will  continue maximally tolerated metformin therapy, if not contraindicated per FDA label, intolerant, or  otherwise not a candidate.

## 2022-12-15 ENCOUNTER — Ambulatory Visit: Payer: Commercial Managed Care - HMO | Admitting: Surgical

## 2022-12-15 DIAGNOSIS — M1711 Unilateral primary osteoarthritis, right knee: Secondary | ICD-10-CM

## 2022-12-15 DIAGNOSIS — M1712 Unilateral primary osteoarthritis, left knee: Secondary | ICD-10-CM

## 2022-12-19 ENCOUNTER — Encounter: Payer: Self-pay | Admitting: Surgical

## 2022-12-19 MED ORDER — BUPIVACAINE HCL 0.25 % IJ SOLN
4.0000 mL | INTRAMUSCULAR | Status: AC | PRN
Start: 2022-12-15 — End: 2022-12-15
  Administered 2022-12-15: 4 mL via INTRA_ARTICULAR

## 2022-12-19 MED ORDER — LIDOCAINE HCL 1 % IJ SOLN
5.0000 mL | INTRAMUSCULAR | Status: AC | PRN
Start: 2022-12-15 — End: 2022-12-15
  Administered 2022-12-15: 5 mL

## 2022-12-19 MED ORDER — METHYLPREDNISOLONE ACETATE 40 MG/ML IJ SUSP
40.0000 mg | INTRAMUSCULAR | Status: AC | PRN
Start: 2022-12-15 — End: 2022-12-15
  Administered 2022-12-15: 40 mg via INTRA_ARTICULAR

## 2022-12-19 NOTE — Progress Notes (Signed)
Office Visit Note   Patient: Maria Greene           Date of Birth: [birth year]           MRN: [id number] Visit Date: 12/15/2022 Requested by: Inez Catalina, MD [address] [place],  [place] [address] PCP: Inez Catalina, MD  Subjective: Chief Complaint  Patient presents with   Left Knee - Pain   Right Knee - Pain    HPI: Maria Greene is a 62 y.o. female who presents to the office reporting bilateral knee pain.  Has history of severe bilateral knee osteoarthritis.  Had knee injections on 09/03/2022 which provided some mild relief that was short-lived.  Gel injection in the past have not been any help.  No history of diabetes.  Primary care provider has put her on Trulicity to help with weight loss but now her insurance will not cover this medication.  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  She is still working on weight loss in order to be approved for knee replacement.  No new falls or injuries..                ROS: All systems reviewed are negative as they relate to the chief complaint within the history of present illness.  Patient denies fevers or chills.  Assessment & Plan: Visit Diagnoses:  1. Unilateral primary osteoarthritis, left knee   2. Unilateral primary osteoarthritis, right knee     Plan: Patient is a 62 year old female who presents for evaluation of bilateral knee pain.  Here today for bilateral knee cortisone injections.  She has history of severe knee arthritis.  Cortisone injections are giving a little bit of relief but it is short-lived.  However it is worth it to continue with these injections for her.  Gel injections are not really any help.  Trying to work on weight loss so that she can proceed with knee replacement.  Cortisone injections administered today and patient tolerated procedure well.  Follow-up as needed with next possible injection in 3 to 4 months.  Follow-Up Instructions: No follow-ups on file.   Orders:  No orders of  the defined types were placed in this encounter.  No orders of the defined types were placed in this encounter.     Procedures: Large Joint Inj: bilateral knee on 12/15/2022 10:52 AM Indications: diagnostic evaluation, joint swelling and pain Details: 18 G 1.5 in needle, superolateral approach  Arthrogram: No  Medications (Right): 5 mL lidocaine 1 %; 4 mL bupivacaine 0.25 %; 40 mg methylPREDNISolone acetate 40 MG/ML Medications (Left): 5 mL lidocaine 1 %; 4 mL bupivacaine 0.25 %; 40 mg methylPREDNISolone acetate 40 MG/ML Outcome: tolerated well, no immediate complications Procedure, treatment alternatives, risks and benefits explained, specific risks discussed. Consent was given by the patient. Immediately prior to procedure a time out was called to verify the correct patient, procedure, equipment, support staff and site/side marked as required. Patient was prepped and draped in the usual sterile fashion.       Clinical Data: No additional findings.  Objective: Vital Signs: There were no vitals taken for this visit.  Physical Exam:  Constitutional: Patient appears well-developed HEENT:  Head: Normocephalic Eyes:EOM are normal Neck: Normal range of motion Cardiovascular: Normal rate Pulmonary/chest: Effort normal Neurologic: Patient is alert Skin: Skin is warm Psychiatric: Patient has normal mood and affect  Ortho Exam: Ortho exam demonstrates bilateral knees with no evidence of cellulitis.  No  ecchymosis.   5 degree flexion contracture present in both knees.  Palpable pedal pulses.  No calf tenderness.  Negative Homans' sign.  Able to perform straight leg raise with both legs.  No effusion in either knee.  Specialty Comments:  No specialty comments available.  Imaging: No results found.   PMFS History: Patient Active Problem List   Diagnosis Date Noted   Constipation by delayed colonic transit 05/30/2022   Onychomycosis 01/23/2022   CKD (chronic kidney disease)  stage 3, GFR 30-59 ml/min (HCC) 10/31/2021   Plantar wart of left foot 10/31/2021   Prediabetes 08/09/2021   OSA (obstructive sleep apnea) 10/11/2019   Preventative health care 05/10/2019   Resistant hypertension 03/08/2019   Morbid obesity with BMI of 50.0-59.9, adult (HCC) 02/15/2019   Bilateral chronic knee pain 02/15/2019   Chronic heart failure with preserved ejection fraction (HCC) 04/06/2018   Past Medical History:  Diagnosis Date   CHF (congestive heart failure) (HCC)    Chronic heart failure with preserved ejection fraction (HCC)    Dyspnea 03/29/2018   Essential hypertension 04/06/2018   Hypertensive urgency 03/29/2018   Mixed hyperlipidemia    OSA (obstructive sleep apnea)     Family History  Problem Relation Age of Onset   Prostate cancer Brother    Cancer - Other Brother        Blood    Past Surgical History:  Procedure Laterality Date   ABDOMINAL HYSTERECTOMY  1998   CHOLECYSTECTOMY     KNEE ARTHROSCOPY     TUBAL LIGATION     Social History   Occupational History   Not on file  Tobacco Use   Smoking status: Never   Smokeless tobacco: Never  Vaping Use   Vaping status: Never Used  Substance and Sexual Activity   Alcohol use: No   Drug use: No   Sexual activity: Not on file

## 2022-12-29 ENCOUNTER — Ambulatory Visit (INDEPENDENT_AMBULATORY_CARE_PROVIDER_SITE_OTHER): Payer: Commercial Managed Care - HMO | Admitting: Internal Medicine

## 2022-12-29 ENCOUNTER — Other Ambulatory Visit: Payer: Self-pay | Admitting: Internal Medicine

## 2022-12-29 VITALS — BP 128/82 | HR 90 | Wt 278.6 lb

## 2022-12-29 DIAGNOSIS — I1A0 Resistant hypertension: Secondary | ICD-10-CM

## 2022-12-29 DIAGNOSIS — R7303 Prediabetes: Secondary | ICD-10-CM | POA: Diagnosis not present

## 2022-12-29 DIAGNOSIS — G4733 Obstructive sleep apnea (adult) (pediatric): Secondary | ICD-10-CM

## 2022-12-29 DIAGNOSIS — M25561 Pain in right knee: Secondary | ICD-10-CM | POA: Diagnosis not present

## 2022-12-29 DIAGNOSIS — G8929 Other chronic pain: Secondary | ICD-10-CM

## 2022-12-29 DIAGNOSIS — N1831 Chronic kidney disease, stage 3a: Secondary | ICD-10-CM

## 2022-12-29 DIAGNOSIS — M25562 Pain in left knee: Secondary | ICD-10-CM | POA: Diagnosis not present

## 2022-12-29 DIAGNOSIS — Z6841 Body Mass Index (BMI) 40.0 and over, adult: Secondary | ICD-10-CM | POA: Diagnosis not present

## 2022-12-29 DIAGNOSIS — E785 Hyperlipidemia, unspecified: Secondary | ICD-10-CM

## 2022-12-29 DIAGNOSIS — E7800 Pure hypercholesterolemia, unspecified: Secondary | ICD-10-CM

## 2022-12-29 DIAGNOSIS — I10 Essential (primary) hypertension: Secondary | ICD-10-CM

## 2022-12-29 DIAGNOSIS — I129 Hypertensive chronic kidney disease with stage 1 through stage 4 chronic kidney disease, or unspecified chronic kidney disease: Secondary | ICD-10-CM

## 2022-12-29 DIAGNOSIS — Z0000 Encounter for general adult medical examination without abnormal findings: Secondary | ICD-10-CM

## 2022-12-29 DIAGNOSIS — Z1231 Encounter for screening mammogram for malignant neoplasm of breast: Secondary | ICD-10-CM

## 2022-12-29 DIAGNOSIS — Z1211 Encounter for screening for malignant neoplasm of colon: Secondary | ICD-10-CM

## 2022-12-29 LAB — POCT GLYCOSYLATED HEMOGLOBIN (HGB A1C): Hemoglobin A1C: 5.7 % — AB (ref 4.0–5.6)

## 2022-12-29 LAB — GLUCOSE, CAPILLARY: Glucose-Capillary: 95 mg/dL (ref 70–99)

## 2022-12-29 MED ORDER — ATORVASTATIN CALCIUM 40 MG PO TABS
40.0000 mg | ORAL_TABLET | Freq: Every day | ORAL | 3 refills | Status: DC
Start: 2022-12-29 — End: 2023-03-13

## 2022-12-29 NOTE — Patient Instructions (Signed)
Ms. Shilling - -  For your weight loss - we will work to get [facility] for you.   For your screening exams - please schedule Mammogram and return stool cards  Please START taking atorvastatin for your cholesterol.   Please come back to see [place] in 4 months.   Thank you!

## 2022-12-29 NOTE — Progress Notes (Signed)
Established Patient Office Visit  Subjective   Patient ID: Maria Greene, female    DOB: 12/18/1960  Age: 62 y.o. MRN: [id number]  Chief Complaint  Patient presents with   Knee Pain    Chronic knee pain-discuss pain options  "Im afraid I'm going to fall"    Obesity    [name] is a 62 year old woman with PMH of Chronic HFpEF with LVH, obesity, knee pain, HTN, CKD, pre-DM, HLD who presents for follow up.   Maria Greene's main life limiting factor right now is her knees.  She notes having severe OA of the bilateral knees.  She follows with orthopedics who have been providing knee injections.  Her last injections were recently and they did help somewhat.  She reports that she will need to lose at least 80# to be a candidate for knee surgery. She was on Trulicity and this helped her to lose 30#, however, her insurance will no longer cover it.  She works as a Engineer, materials, but is looking into disability as she is now even having trouble going up the steps to get to her job.  She is scared she will fall.  She has fallen at least twice at home and has almost fallen at work.   We further discussed her CKD, and I answered her questions.  She is not on atorvastatin and I advised her to start taking this again.  She is due for colon cancer and breast cancer screening.     Review of Systems  Constitutional:  Negative for chills, fever, malaise/fatigue and weight loss.  Cardiovascular:  Positive for leg swelling (at the knees). Negative for chest pain.  Genitourinary:  Negative for dysuria and urgency.  Musculoskeletal:  Positive for falls and joint pain.  Neurological:  Negative for dizziness, focal weakness, weakness and headaches.  Psychiatric/Behavioral:  Negative for depression. The patient is not nervous/anxious.       Objective:     BP 128/82 (BP Location: Left Arm, Patient Position: Sitting, Cuff Size: Large)   Pulse 90   Wt 278 lb 9.6 oz (126.4 kg)   SpO2 100%   BMI 50.96 kg/m   BP Readings from Last 3 Encounters:  12/29/22 128/82  05/30/22 119/74  01/23/22 137/89   Wt Readings from Last 3 Encounters:  12/29/22 278 lb 9.6 oz (126.4 kg)  05/30/22 272 lb 3.2 oz (123.5 kg)  01/23/22 282 lb 1.6 oz (128 kg)      Physical Exam Vitals and nursing note reviewed.  Constitutional:      General: She is not in acute distress.    Appearance: She is obese. She is not toxic-appearing.  HENT:     Head: Normocephalic and atraumatic.  Cardiovascular:     Rate and Rhythm: Normal rate and regular rhythm.     Heart sounds: No murmur heard. Pulmonary:     Effort: Pulmonary effort is normal. No respiratory distress.  Musculoskeletal:        General: Tenderness (at the knees) present.     Comments: Large knee joints, swelling around, tender to palpation  Skin:    General: Skin is warm and dry.     Findings: No rash.  Neurological:     General: No focal deficit present.     Mental Status: She is alert and oriented to person, place, and time.  Psychiatric:        Mood and Affect: Mood normal.        Behavior: Behavior  normal.      Results for orders placed or performed in visit on 12/29/22  Glucose, capillary  Result Value Ref Range   Glucose-Capillary 95 70 - 99 mg/dL  CZY6+AYTKZ Gap  Result Value Ref Range   Glucose 91 70 - 99 mg/dL   BUN 23 6 - 24 mg/dL   Creatinine, Ser 6.01 (H) 0.57 - 1.00 mg/dL   eGFR 55 (L) >09 NA/TFT/7.32   BUN/Creatinine Ratio 20 9 - 23   Sodium 141 134 - 144 mmol/L   Potassium 4.5 3.5 - 5.2 mmol/L   Chloride 101 96 - 106 mmol/L   CO2 23 20 - 29 mmol/L   Anion Gap 17.0 10.0 - 18.0 mmol/L   Calcium 9.1 8.7 - 10.2 mg/dL  Lipid Profile  Result Value Ref Range   Cholesterol, Total 187 100 - 199 mg/dL   Triglycerides 81 0 - 149 mg/dL   HDL 45 >20 mg/dL   VLDL Cholesterol Cal 15 5 - 40 mg/dL   LDL Chol Calc (NIH) 254 (H) 0 - 99 mg/dL   Chol/HDL Ratio 4.2 0.0 - 4.4 ratio  POC Hbg A1C  Result Value Ref Range   Hemoglobin A1C 5.7  (A) 4.0 - 5.6 %   HbA1c POC (<> result, manual entry)     HbA1c, POC (prediabetic range)     HbA1c, POC (controlled diabetic range)        The 10-year ASCVD risk score (Arnett DK, et al., 2019) is: 6.9%    Assessment & Plan:   Problem List Items Addressed This Visit       Unprioritized   Morbid obesity with BMI of 50.0-59.9, adult (HCC) (Chronic)    Attempt to get [facility] covered with new indication of HTN and HFpEF. She will need to lose weight to be able to get her bilateral knee surgeries.       Relevant Medications   Semaglutide-Weight Management (WEGOVY) 0.25 MG/0.5ML SOAJ   Bilateral chronic knee pain (Chronic)    Following with orthopedics, needs to lose about 80# prior to surgery planning.    Will attempt to get [facility] covered for her.       Essential hypertension (Chronic)    Blood pressure much better controlled today on olmesartan-hydrochlorothiazide and spironolactone today.  We will plan to get a BMET today to check on her renal function.    Plan Continue triple therapy Monitor K and renal function.       Relevant Medications   atorvastatin (LIPITOR) 40 MG tablet   Semaglutide-Weight Management (WEGOVY) 0.25 MG/0.5ML SOAJ   OSA (obstructive sleep apnea) (Chronic)    Has not rescheduled sleep study.  Last in 2021.       Prediabetes - Primary (Chronic)    A1C remains 5.7.  Continue to monitor yearly.       Relevant Medications   atorvastatin (LIPITOR) 40 MG tablet   Semaglutide-Weight Management (WEGOVY) 0.25 MG/0.5ML SOAJ   Other Relevant Orders   POC Hbg A1C (Completed)   Lipid Profile (Completed)   CKD (chronic kidney disease) stage 3, GFR 30-59 ml/min (HCC) (Chronic)    BMET today for monitoring.  She wanted more information about this issue and we discussed at length and I answered questions.       Relevant Medications   atorvastatin (LIPITOR) 40 MG tablet   Semaglutide-Weight Management (WEGOVY) 0.25 MG/0.5ML SOAJ   Other Relevant Orders    BMP8+Anion Gap (Completed)   Lipid Profile (Completed)   Preventative health care  Mammogram and FIT testing ordered today.       Hyperlipidemia    On statin, particularly given her weight and pre-diabetes.  LDL was 127.  Encouraged her to start taking her statin.       Relevant Medications   atorvastatin (LIPITOR) 40 MG tablet   Other Visit Diagnoses     Breast cancer screening by mammogram       Relevant Orders   MM Digital Screening   Screening for colon cancer       Relevant Orders   Fecal occult blood, imunochemical       Return in about 4 months (around 04/30/2023).    Debe Coder, MD

## 2022-12-30 LAB — BMP8+ANION GAP
Anion Gap: 17 mmol/L (ref 10.0–18.0)
BUN/Creatinine Ratio: 20 (ref 9–23)
BUN: 23 mg/dL (ref 6–24)
CO2: 23 mmol/L (ref 20–29)
Calcium: 9.1 mg/dL (ref 8.7–10.2)
Chloride: 101 mmol/L (ref 96–106)
Creatinine, Ser: 1.14 mg/dL — ABNORMAL HIGH (ref 0.57–1.00)
Glucose: 91 mg/dL (ref 70–99)
Potassium: 4.5 mmol/L (ref 3.5–5.2)
Sodium: 141 mmol/L (ref 134–144)
eGFR: 55 mL/min/{1.73_m2} — ABNORMAL LOW (ref >59)

## 2022-12-30 LAB — LIPID PANEL
Chol/HDL Ratio: 4.2 ratio (ref 0.0–4.4)
Cholesterol, Total: 187 mg/dL (ref 100–199)
HDL: 45 mg/dL (ref >39)
LDL Chol Calc (NIH): 127 mg/dL — ABNORMAL HIGH (ref 0–99)
Triglycerides: 81 mg/dL (ref 0–149)
VLDL Cholesterol Cal: 15 mg/dL (ref 5–40)

## 2023-01-05 ENCOUNTER — Telehealth: Payer: Self-pay | Admitting: Internal Medicine

## 2023-01-05 DIAGNOSIS — E785 Hyperlipidemia, unspecified: Secondary | ICD-10-CM | POA: Insufficient documentation

## 2023-01-05 MED ORDER — WEGOVY 0.25 MG/0.5ML ~~LOC~~ SOAJ: 0.2500 mg | SUBCUTANEOUS | 3 refills | Status: DC

## 2023-01-05 NOTE — Assessment & Plan Note (Signed)
Following with orthopedics, needs to lose about 80# prior to surgery planning.    Will attempt to get [facility] covered for her.

## 2023-01-05 NOTE — Assessment & Plan Note (Signed)
Blood pressure much better controlled today on olmesartan-hydrochlorothiazide and spironolactone today.  We will plan to get a BMET today to check on her renal function.    Plan Continue triple therapy Monitor K and renal function.

## 2023-01-05 NOTE — Assessment & Plan Note (Signed)
Attempt to get [facility] covered with new indication of HTN and HFpEF. She will need to lose weight to be able to get her bilateral knee surgeries.

## 2023-01-05 NOTE — Assessment & Plan Note (Signed)
BMET today for monitoring.  She wanted more information about this issue and we discussed at length and I answered questions.

## 2023-01-05 NOTE — Assessment & Plan Note (Signed)
Has not rescheduled sleep study.  Last in 2021.

## 2023-01-05 NOTE — Telephone Encounter (Signed)
Pt is rtn a call back. Pt states was to get a call back about her recent lab work.

## 2023-01-05 NOTE — Telephone Encounter (Signed)
Called patient back and discussed blood test results.  No change to therapeutics/plan.

## 2023-01-05 NOTE — Assessment & Plan Note (Signed)
On statin, particularly given her weight and pre-diabetes.  LDL was 127.  Encouraged her to start taking her statin.

## 2023-01-05 NOTE — Assessment & Plan Note (Signed)
Mammogram and FIT testing ordered today.

## 2023-01-05 NOTE — Assessment & Plan Note (Signed)
A1C remains 5.7.  Continue to monitor yearly.

## 2023-01-23 ENCOUNTER — Other Ambulatory Visit: Payer: Self-pay | Admitting: Internal Medicine

## 2023-01-23 DIAGNOSIS — Z1212 Encounter for screening for malignant neoplasm of rectum: Secondary | ICD-10-CM

## 2023-01-23 DIAGNOSIS — Z1211 Encounter for screening for malignant neoplasm of colon: Secondary | ICD-10-CM

## 2023-03-02 ENCOUNTER — Ambulatory Visit: Payer: Self-pay | Admitting: Orthopedic Surgery

## 2023-03-12 ENCOUNTER — Telehealth: Payer: Self-pay | Admitting: *Deleted

## 2023-03-12 MED ORDER — DICLOFENAC SODIUM 1 % EX GEL: 4.0000 g | Freq: Four times a day (QID) | CUTANEOUS | 1 refills | Status: AC

## 2023-03-12 MED ORDER — ACETAMINOPHEN 500 MG PO TABS: 1000.0000 mg | ORAL_TABLET | Freq: Three times a day (TID) | ORAL | 0 refills | Status: AC | PRN

## 2023-03-12 NOTE — Telephone Encounter (Signed)
Call from patient with c/o of severe knee pain.  Was to get knee injections but her insurance changed and she will have to wait until 04/2023.  Patient is requesting something ro help her with the knee pain until she can have her injections.  Was given an appointment for March 31, 2023 in the Clinics.  Wants to know if she can get something for the pain until then.  Would like prescription called to the  [facility] on [address].

## 2023-03-12 NOTE — Telephone Encounter (Signed)
Call to patient for appointment opening on tomorrow morning at 10:15 AM.  Patient took the appointment and was advised to take Tylenol and to use Voltaren Gel.  Can use up to 4 times a day .

## 2023-03-13 ENCOUNTER — Ambulatory Visit: Payer: Self-pay | Admitting: Student

## 2023-03-13 ENCOUNTER — Other Ambulatory Visit (HOSPITAL_COMMUNITY): Payer: Self-pay

## 2023-03-13 VITALS — BP 147/89 | HR 88 | Temp 98.1°F | Ht 62.0 in | Wt 279.8 lb

## 2023-03-13 DIAGNOSIS — G8929 Other chronic pain: Secondary | ICD-10-CM

## 2023-03-13 DIAGNOSIS — M25562 Pain in left knee: Secondary | ICD-10-CM

## 2023-03-13 DIAGNOSIS — R7303 Prediabetes: Secondary | ICD-10-CM

## 2023-03-13 DIAGNOSIS — M25561 Pain in right knee: Secondary | ICD-10-CM

## 2023-03-13 DIAGNOSIS — I1A0 Resistant hypertension: Secondary | ICD-10-CM

## 2023-03-13 DIAGNOSIS — Z6841 Body Mass Index (BMI) 40.0 and over, adult: Secondary | ICD-10-CM

## 2023-03-13 DIAGNOSIS — I10 Essential (primary) hypertension: Secondary | ICD-10-CM

## 2023-03-13 DIAGNOSIS — E785 Hyperlipidemia, unspecified: Secondary | ICD-10-CM

## 2023-03-13 DIAGNOSIS — E7800 Pure hypercholesterolemia, unspecified: Secondary | ICD-10-CM

## 2023-03-13 DIAGNOSIS — N1831 Chronic kidney disease, stage 3a: Secondary | ICD-10-CM

## 2023-03-13 MED ORDER — LOSARTAN POTASSIUM-HCTZ 50-12.5 MG PO TABS
1.0000 | ORAL_TABLET | Freq: Every day | ORAL | 3 refills | Status: DC
  Filled 2023-03-13 – 2023-04-13 (×2): qty 30, 30d supply, fill #0
  Filled 2023-06-15: qty 30, 30d supply, fill #1
  Filled 2023-07-30 – 2023-08-11 (×2): qty 30, 30d supply, fill #2
  Filled 2023-10-06: qty 30, 30d supply, fill #3
  Filled 2023-12-24 – 2024-01-08 (×2): qty 30, 30d supply, fill #4
  Filled 2024-02-28: qty 30, 30d supply, fill #5

## 2023-03-13 MED ORDER — ATORVASTATIN CALCIUM 40 MG PO TABS
40.0000 mg | ORAL_TABLET | Freq: Every day | ORAL | 3 refills | Status: AC
  Filled 2023-03-13 – 2023-04-13 (×2): qty 30, 30d supply, fill #0
  Filled 2023-06-15: qty 30, 30d supply, fill #1

## 2023-03-13 MED ORDER — SPIRONOLACTONE 50 MG PO TABS
50.0000 mg | ORAL_TABLET | Freq: Every day | ORAL | 3 refills | Status: DC
  Filled 2023-03-13 – 2023-04-13 (×2): qty 30, 30d supply, fill #0
  Filled 2023-06-15: qty 30, 30d supply, fill #1
  Filled 2023-07-30 – 2023-08-11 (×2): qty 30, 30d supply, fill #2
  Filled 2023-10-06: qty 30, 30d supply, fill #3
  Filled 2023-12-24 – 2024-01-08 (×2): qty 30, 30d supply, fill #4
  Filled 2024-02-28: qty 30, 30d supply, fill #5

## 2023-03-13 NOTE — Patient Instructions (Signed)
Thank you, [name] for allowing [place] to provide your care today.   I have ordered the following medication/changed the following medications:   Stop the following medications: Medications Discontinued During This Encounter  Medication Reason   olmesartan-hydrochlorothiazide (BENICAR HCT) 40-25 MG tablet    spironolactone (ALDACTONE) 50 MG tablet Reorder   atorvastatin (LIPITOR) 40 MG tablet Reorder     Start the following medications: Meds ordered this encounter  Medications   atorvastatin (LIPITOR) 40 MG tablet    Sig: Take 1 tablet (40 mg total) by mouth daily.    Dispense:  90 tablet    Refill:  3    IMTP   losartan-hydrochlorothiazide (HYZAAR) 50-12.5 MG tablet    Sig: Take 1 tablet by mouth daily.    Dispense:  90 tablet    Refill:  3    IMTP   spironolactone (ALDACTONE) 50 MG tablet    Sig: TAKE 1 TABLET(50 MG) BY MOUTH DAILY    Dispense:  90 tablet    Refill:  3    IMTP     Follow up:  2 months     Remember:   For your knee pain:   Take Tylenol 1000 mg every 8 hours (you can schedule it initially but then use it as needed)  Use Voltaren gel every 4 hours  Wear lidocaine patch daily  Use your knee brace at all times.   For your [phone], we will reach out to our pharamacist. You will hear from [place] if you do not, please give [place] a call.   Sending your refills to [facility].    Should you have any questions or concerns please call the internal medicine clinic at [phone].     Manuela Neptune, MD [facility]

## 2023-03-13 NOTE — Assessment & Plan Note (Signed)
Requesting to have the 4 dollar list medications. Benicar not on 4 dollar list. Benicar changed to Losartan 50mg  hydrochlorothiazide today.  BP elevated today. Patient reports adherence.   -Start losartan-HCZ 50mg -12.5mg   -cw Spironolactone 50mc daily

## 2023-03-13 NOTE — Assessment & Plan Note (Addendum)
Statin sent to [facility] for pick up. ASCVD risk is 10.3%. LDL 127 Has not started her statin yet.   -Atorvastatin 40mg  daily

## 2023-03-13 NOTE — Assessment & Plan Note (Addendum)
Has a history of chronic bilateral OA. Sees Ortho for it, was told that she needed to loose weight to be eligible for the bilateral knee replacements. Had lost about 20 lbs with Trulicity but then insurance was not covering her. At last visit she was supposed to go on Wegovy but she has not been able to get it. She cannot get steroid injections at this time because of her insurance. It will go through in January. Has tried ibuprofen 800mg  BID to make it to work. Has not tried much of the tylenol. Recently bough a knee brace. Has lidocaine patches but has been using in intermittently.   -Tylenol 1000mg  q8hrs (can schedule and then use as needed)  -Avoid Ibuprofen given CKD   -Wear knee brace  -Apply voltaren gel up to 4 times daily  -Lidocaine patches daily  -FU with Ortho when able -Weight loss (see morbid obesity)

## 2023-03-13 NOTE — Progress Notes (Signed)
CC:  Chief Complaint  Patient presents with   Knee Pain   Follow-up   HPI:  [name] is a 62 y.o. female living with a history stated below and presents today for a follow up on her knee pain. Please see problem based assessment and plan for additional details.  Past Medical History:  Diagnosis Date   CHF (congestive heart failure) (HCC)    Chronic heart failure with preserved ejection fraction (HCC)    Dyspnea 03/29/2018   Essential hypertension 04/06/2018   Hypertensive urgency 03/29/2018   Mixed hyperlipidemia    OSA (obstructive sleep apnea)     Current Outpatient Medications on File Prior to Visit  Medication Sig Dispense Refill   acetaminophen (TYLENOL) 500 MG tablet Take 2 tablets (1,000 mg total) by mouth every 8 (eight) hours as needed for moderate pain (pain score 4-6). 180 tablet 0   diclofenac Sodium (VOLTAREN) 1 % GEL Apply 4 g topically 4 (four) times daily. 350 g 1   lidocaine 4 % Place 1 patch onto the skin daily. 30 patch 0   Multiple Vitamins-Minerals (MULTI FOR HER 50+) TABS Take 1 tablet by mouth daily.     Semaglutide-Weight Management (WEGOVY) 0.25 MG/0.5ML SOAJ Inject 0.25 mg into the skin once a week. 6 mL 3   No current facility-administered medications on file prior to visit.    Family History  Problem Relation Age of Onset   Prostate cancer Brother    Cancer - Other Brother        Blood    Social History   Socioeconomic History   Marital status: Divorced    Spouse name: Not on file   Number of children: Not on file   Years of education: Not on file   Highest education level: Not on file  Occupational History   Not on file  Tobacco Use   Smoking status: Never   Smokeless tobacco: Never  Vaping Use   Vaping status: Never Used  Substance and Sexual Activity   Alcohol use: No   Drug use: No   Sexual activity: Not on file  Other Topics Concern   Not on file  Social History Narrative   Not on file   Social Determinants of  Health   Financial Resource Strain: Medium Risk (05/30/2022)   Overall Financial Resource Strain (CARDIA)    Difficulty of Paying Living Expenses: Somewhat hard  Food Insecurity: Food Insecurity Present (05/30/2022)   Hunger Vital Sign    Worried About Running Out of Food in the Last Year: Sometimes true    Ran Out of Food in the Last Year: Sometimes true  Transportation Needs: No Transportation Needs (05/30/2022)   PRAPARE - Administrator, Civil Service (Medical): No    Lack of Transportation (Non-Medical): No  Physical Activity: Inactive (05/30/2022)   Exercise Vital Sign    Days of Exercise per Week: 0 days    Minutes of Exercise per Session: 0 min  Stress: Stress Concern Present (05/30/2022)   [employer] of Occupational Health - Occupational Stress Questionnaire    Feeling of Stress : To some extent  Social Connections: Moderately Integrated (05/30/2022)   Social Connection and Isolation Panel [NHANES]    Frequency of Communication with Friends and Family: More than three times a week    Frequency of Social Gatherings with Friends and Family: More than three times a week    Attends Religious Services: More than 4 times per year  Active Member of Clubs or Organizations: Yes    Attends Banker Meetings: More than 4 times per year    Marital Status: Divorced  Intimate Partner Violence: Not At Risk (05/30/2022)   Humiliation, Afraid, Rape, and Kick questionnaire    Fear of Current or Ex-Partner: No    Emotionally Abused: No    Physically Abused: No    Sexually Abused: No    Review of Systems: ROS negative except for what is noted on the assessment and plan.  Vitals:   03/13/23 1015  BP: (!) 147/89  Pulse: 88  Temp: 98.1 F (36.7 C)  TempSrc: Oral  SpO2: 94%  Weight: 279 lb 12.8 oz (126.9 kg)  Height: 5\' 2"  (1.575 m)    Physical Exam: Constitutional: well-appearing  in NAD HENT: normocephalic atraumatic, mucous membranes moist Eyes: conjunctiva  non-erythematous Cardiovascular: regular rate and rhythm, no m/r/g Pulmonary/Chest: normal work of breathing on room air, lungs clear to auscultation bilaterally Abdominal: soft, non-tender, non-distended MSK: normal bulk and tone, obese. L Knee tender on the MCL and patellar regions. No erythema or fluctuation. No warmth on palpation. R knee tender on the LCL.  Neurological: alert & oriented x 3, no focal deficit Skin: warm and dry Psych: normal mood and behavior  Assessment & Plan:   Patient seen with Dr. Lafonda Mosses  Bilateral chronic knee pain Has a history of chronic bilateral OA. Sees Ortho for it, was told that she needed to loose weight to be eligible for the bilateral knee replacements. Had lost about 20 lbs with Trulicity but then insurance was not covering her. At last visit she was supposed to go on Wegovy but she has not been able to get it. She cannot get steroid injections at this time because of her insurance. It will go through in January. Has tried ibuprofen 800mg  BID to make it to work. Has not tried much of the tylenol. Recently bough a knee brace. Has lidocaine patches but has been using in intermittently.   -Tylenol 1000mg  q8hrs (can schedule and then use as needed)  -Avoid Ibuprofen given CKD   -Wear knee brace  -Apply voltaren gel up to 4 times daily  -Lidocaine patches daily  -FU with Ortho when able -Weight loss (see morbid obesity)  Morbid obesity with BMI of 50.0-59.9, adult [facility]) Patient would like to go on Wegovy. I will follow-up on the status of this with [facility]. She also has not had a clean diet and was counseled on the benefits of eating healthy. She is at the contemplation stage where she knows she needs to change. Patient weighed risks and benefits with me and the benefits of her weight loss journey to get her knee replacements. At the end of the visit she states "I need to loose weight". Amenable to start doing lifestyle modifications.   -Lifestyle  modifications- eating healthy, measuring how much she eats.  -WGNFAO   Essential hypertension Requesting to have the 4 dollar list medications. Benicar not on 4 dollar list. Benicar changed to Losartan 50mg  hydrochlorothiazide today.  BP elevated today. Patient reports adherence.   -Start losartan-HCZ 50mg -12.5mg   -cw Spironolactone 50mc daily   Hyperlipidemia Statin sent to  pharmacy for pick up. ASCVD risk is 10.3%. LDL 127 Has not started her statin yet.   -Atorvastatin 40mg  daily  Manuela Neptune, MD [facility] Internal Medicine, PGY-1 Phone: [phone] Date 03/13/2023 Time 3:27 PM

## 2023-03-13 NOTE — Assessment & Plan Note (Addendum)
Patient would like to go on Wegovy. I will follow-up on the status of this with [facility]. She also has not had a clean diet and was counseled on the benefits of eating healthy. She is at the contemplation stage where she knows she needs to change. Patient weighed risks and benefits with me and the benefits of her weight loss journey to get her knee replacements. At the end of the visit she states "I need to loose weight". Amenable to start doing lifestyle modifications.   -Lifestyle modifications- eating healthy, measuring how much she eats.  -ZOXWRU   Addendum:   At the moment there are no patient assistance programs for weight loss medications, unfortunately. Patient will get new insurance in January. Can consider then.

## 2023-03-16 ENCOUNTER — Other Ambulatory Visit (HOSPITAL_COMMUNITY): Payer: Self-pay

## 2023-03-23 NOTE — Progress Notes (Signed)
Internal Medicine Clinic Attending  I was physically present during the key portions of the resident provided service and participated in the medical decision making of patient's management care. I reviewed pertinent patient test results.  The assessment, diagnosis, and plan were formulated together and I agree with the documentation in the resident's note.  Mercie Eon, MD

## 2023-03-25 ENCOUNTER — Other Ambulatory Visit (HOSPITAL_COMMUNITY): Payer: Self-pay

## 2023-03-31 ENCOUNTER — Encounter: Payer: Self-pay | Admitting: Internal Medicine

## 2023-04-13 ENCOUNTER — Other Ambulatory Visit (HOSPITAL_COMMUNITY): Payer: Self-pay

## 2023-05-06 ENCOUNTER — Ambulatory Visit: Payer: Self-pay | Admitting: Orthopedic Surgery

## 2023-05-12 ENCOUNTER — Encounter: Payer: Self-pay | Admitting: Internal Medicine

## 2023-06-15 ENCOUNTER — Other Ambulatory Visit (HOSPITAL_COMMUNITY): Payer: Self-pay

## 2023-06-22 ENCOUNTER — Encounter: Payer: Self-pay | Admitting: Student

## 2023-06-22 ENCOUNTER — Ambulatory Visit (INDEPENDENT_AMBULATORY_CARE_PROVIDER_SITE_OTHER): Payer: Self-pay | Admitting: Student

## 2023-06-22 VITALS — BP 133/87 | HR 76 | Temp 98.3°F | Ht 62.0 in | Wt 283.5 lb

## 2023-06-22 DIAGNOSIS — Z6841 Body Mass Index (BMI) 40.0 and over, adult: Secondary | ICD-10-CM

## 2023-06-22 DIAGNOSIS — M25561 Pain in right knee: Secondary | ICD-10-CM | POA: Diagnosis not present

## 2023-06-22 DIAGNOSIS — Z1211 Encounter for screening for malignant neoplasm of colon: Secondary | ICD-10-CM

## 2023-06-22 DIAGNOSIS — M25562 Pain in left knee: Secondary | ICD-10-CM

## 2023-06-22 DIAGNOSIS — M6530 Trigger finger, unspecified finger: Secondary | ICD-10-CM | POA: Insufficient documentation

## 2023-06-22 DIAGNOSIS — E785 Hyperlipidemia, unspecified: Secondary | ICD-10-CM | POA: Diagnosis not present

## 2023-06-22 DIAGNOSIS — G8929 Other chronic pain: Secondary | ICD-10-CM

## 2023-06-22 DIAGNOSIS — R7303 Prediabetes: Secondary | ICD-10-CM | POA: Diagnosis not present

## 2023-06-22 DIAGNOSIS — I10 Essential (primary) hypertension: Secondary | ICD-10-CM | POA: Diagnosis not present

## 2023-06-22 DIAGNOSIS — Z0000 Encounter for general adult medical examination without abnormal findings: Secondary | ICD-10-CM

## 2023-06-22 DIAGNOSIS — E7800 Pure hypercholesterolemia, unspecified: Secondary | ICD-10-CM

## 2023-06-22 DIAGNOSIS — M65331 Trigger finger, right middle finger: Secondary | ICD-10-CM

## 2023-06-22 LAB — GLUCOSE, CAPILLARY: Glucose-Capillary: 109 mg/dL — ABNORMAL HIGH (ref 70–99)

## 2023-06-22 LAB — POCT GLYCOSYLATED HEMOGLOBIN (HGB A1C): Hemoglobin A1C: 5.8 % — AB (ref 4.0–5.6)

## 2023-06-22 MED ORDER — SEMAGLUTIDE-WEIGHT MANAGEMENT 1.7 MG/0.75ML ~~LOC~~ SOAJ: 1.7000 mg | SUBCUTANEOUS | 0 refills | Status: AC

## 2023-06-22 MED ORDER — SEMAGLUTIDE-WEIGHT MANAGEMENT 2.4 MG/0.75ML ~~LOC~~ SOAJ
2.4000 mg | SUBCUTANEOUS | 0 refills | Status: AC
Start: 2023-10-16 — End: 2023-11-13

## 2023-06-22 MED ORDER — SEMAGLUTIDE-WEIGHT MANAGEMENT 1 MG/0.5ML ~~LOC~~ SOAJ: 1.0000 mg | SUBCUTANEOUS | 0 refills | Status: AC

## 2023-06-22 MED ORDER — IBUPROFEN 800 MG PO TABS: 800.0000 mg | ORAL_TABLET | Freq: Three times a day (TID) | ORAL | 0 refills | Status: AC | PRN

## 2023-06-22 MED ORDER — SEMAGLUTIDE-WEIGHT MANAGEMENT 0.5 MG/0.5ML ~~LOC~~ SOAJ
0.5000 mg | SUBCUTANEOUS | 0 refills | Status: AC
Start: 2023-07-21 — End: 2023-08-18

## 2023-06-22 MED ORDER — SEMAGLUTIDE-WEIGHT MANAGEMENT 0.25 MG/0.5ML ~~LOC~~ SOAJ
0.2500 mg | SUBCUTANEOUS | 0 refills | Status: AC
Start: 2023-06-22 — End: 2023-07-20

## 2023-06-22 NOTE — Assessment & Plan Note (Signed)
 Mammogram ordered at last visit, patient has not followed up yet but she plans to.  She lost her Cologuard kit, so I have represcribed her new one.

## 2023-06-22 NOTE — Patient Instructions (Addendum)
 Thank you, Maria Greene for allowing [place] to provide your care today.   Your blood pressure was normal today. Keep taking your medications as prescribed including your olmesartan-hydrochlorothiazide and spironolactone.  We are checking a basic metabolic panel today to evaluate kidney function and electrolytes.  I will call you soon as I have the results.  Your hemoglobin A1c was 5.8%, which is still within the prediabetes range.  Please continue to reduce the amount of simple sugars and carbohydrates in your diet, increase amount of fiber in your diet and leafy greens.  For your osteoarthritis, I have sent in a prescription of ibuprofen, which you may use for short time.  As we discussed, please stagger this with Tylenol and use Voltaren gel, which she can get over-the-counter.  It is not good for you to take this medication long-term for your kidney function.  I have placed a referral to orthopedic surgery for steroid injections in your knees.  They should call you to schedule an appointment.  Please be sure to get a mammogram and return the stool test in lieu of a colonoscopy. It will be mailed to you with instructions.   I have sent in a prescription for [facility] for your weight loss and diabetes.  For your trigger finger, I recommend using a finger splint for the next few weeks, especially when you sleep.  If the symptoms do not improve after that, please give [place] a call and we can consider other options.  I have ordered the following labs for you:   Lab Orders         BMP8+Anion Gap         Glucose, capillary         POC Hbg A1C      Referrals ordered today:    Referral Orders         Ambulatory referral to Orthopedic Surgery      I have ordered the following medication/changed the following medications:   Start the following medications: Meds ordered this encounter  Medications   ibuprofen (ADVIL) 800 MG tablet    Sig: Take 1 tablet (800 mg total) by mouth every 8 (eight)  hours as needed.    Dispense:  30 tablet    Refill:  0   Semaglutide-Weight Management 0.25 MG/0.5ML SOAJ    Sig: Inject 0.25 mg into the skin once a week for 28 days.    Dispense:  2 mL    Refill:  0   Semaglutide-Weight Management 0.5 MG/0.5ML SOAJ    Sig: Inject 0.5 mg into the skin once a week for 28 days.    Dispense:  2 mL    Refill:  0   Semaglutide-Weight Management 1 MG/0.5ML SOAJ    Sig: Inject 1 mg into the skin once a week for 28 days.    Dispense:  2 mL    Refill:  0   Semaglutide-Weight Management 1.7 MG/0.75ML SOAJ    Sig: Inject 1.7 mg into the skin once a week for 28 days.    Dispense:  3 mL    Refill:  0   Semaglutide-Weight Management 2.4 MG/0.75ML SOAJ    Sig: Inject 2.4 mg into the skin once a week for 28 days.    Dispense:  3 mL    Refill:  0     Follow up: 4 weeks  We look forward to seeing you next time. Please call our clinic at [phone] if you have any questions or concerns. The  best time to call is Monday-Friday from 9am-4pm, but there is someone available 24/7. If after hours or the weekend, call the main hospital number and ask for the Internal Medicine Resident On-Call. If you need medication refills, please notify your pharmacy one week in advance and they will send [place] a request.   Thank you for trusting me with your care. Wishing you the best!   Annett Fabian, MD [facility] Center

## 2023-06-22 NOTE — Assessment & Plan Note (Addendum)
 Lab Results  Component Value Date   HGBA1C 5.8 (A) 06/22/2023   HGBA1C 5.7 (A) 12/29/2022   HGBA1C 5.7 (A) 05/30/2022    History of prediabetes, hemoglobin A1c mildly increased since last visit. No medications currently. Asymptomatic.  Previously on Trulicity.  I sent in a prescription for [facility] for both her morbid obesity and prediabetes.

## 2023-06-22 NOTE — Assessment & Plan Note (Signed)
 She reports trigger finger of the middle finger on the right hand which is intermittent, but has worsened recently.  It is not present on exam today.  I have recommended she purchase a finger splint and use it for the next few weeks, especially while she is sleeping.  If her symptoms do not improve following this trial of immobilization, she may need treatment with injections.

## 2023-06-22 NOTE — Assessment & Plan Note (Addendum)
 BMI 51.85.  Patient has morbid obesity complicated by severe bilateral osteoarthritis, prediabetes, and hypertension.  She has been actively modifying her diet and exercising as maximally tolerated given her severe knee pain.  Despite this, she has not been able to lose significant weight and feels like her knee pain is worsening.   She was previously on Trulicity for both her prediabetes and weight loss, but switched insurance and lost coverage. I feel that she would benefit greatly from restarting a GLP-1, so I have sent in a prescription for Wegovy 0.25 mg weekly.  Her ability to lose weight via exercise is significantly hindered by her severe bilateral osteoarthritis.  We will follow-up about this in 4 weeks.

## 2023-06-22 NOTE — Assessment & Plan Note (Addendum)
 BP Readings from Last 3 Encounters:  06/22/23 133/87  03/13/23 (!) 147/89  12/29/22 128/82   History of hypertension.  Well-controlled.  Asymptomatic. Currently taking losartan-HCZ 50mg -12.5mg  and spironolactone 50 mg daily with no concerns. We will recheck a BMP today.

## 2023-06-22 NOTE — Progress Notes (Signed)
 [facility] Clinic Attending  Case discussed with the resident at the time of the visit.  We reviewed the resident's history and exam and pertinent patient test results.  I agree with the assessment, diagnosis, and plan of care documented in the resident's note.

## 2023-06-22 NOTE — Progress Notes (Signed)
 CC: Routine Follow Up for T2DM, HTN after last office visit 03/13/2023  HPI:  Maria Greene is a 63 y.o. female with pertinent PMH of HTN, diabetes, obesity who presents to the clinic for routine f/u. Please see assessment and plan below for further details.  Review of Systems:   Pertinent items noted in HPI and/or A&P.  Physical Exam:  Vitals:   06/22/23 1005 06/22/23 1043  BP: (!) 150/98 133/87  Pulse: 81 76  Temp: 98.3 F (36.8 C)   TempSrc: Oral   SpO2: 99%   Weight: 283 lb 8 oz (128.6 kg)   Height: 5\' 2"  (1.575 m)    Fatigued appearing, obese middle-aged woman Regular heart rate and rhythm, no murmur, no lower extremity edema Normal respiratory rate and effort   Assessment & Plan:   Essential hypertension BP Readings from Last 3 Encounters:  06/22/23 133/87  03/13/23 (!) 147/89  12/29/22 128/82   History of hypertension.  Well-controlled.  Asymptomatic. Currently taking losartan-HCZ 50mg -12.5mg  and spironolactone 50 mg daily with no concerns. We will recheck a BMP today.   Prediabetes Lab Results  Component Value Date   HGBA1C 5.8 (A) 06/22/2023   HGBA1C 5.7 (A) 12/29/2022   HGBA1C 5.7 (A) 05/30/2022    History of prediabetes, hemoglobin A1c mildly increased since last visit. No medications currently. Asymptomatic.  Previously on Trulicity.  I sent in a prescription for [facility] for both her morbid obesity and prediabetes.  Hyperlipidemia History of hyperlipidemia.  The 10-year ASCVD risk score (Arnett DK, et al., 2019) is: 8%.  She is currently on atorvastatin 40 mg daily.  Will continue this medication. Spoke with her about dietary changes to reduce her cholesterol.   Morbid obesity with BMI of 50.0-59.9, adult (HCC) BMI 51.85.  Patient has morbid obesity complicated by severe bilateral osteoarthritis, prediabetes, and hypertension.  She has been actively modifying her diet and exercising as maximally tolerated given her severe knee pain.  Despite this, she  has not been able to lose significant weight and feels like her knee pain is worsening.   She was previously on Trulicity for both her prediabetes and weight loss, but switched insurance and lost coverage. I feel that she would benefit greatly from restarting a GLP-1, so I have sent in a prescription for Wegovy 0.25 mg weekly.  Her ability to lose weight via exercise is significantly hindered by her severe bilateral osteoarthritis.  We will follow-up about this in 4 weeks.  Preventative health care Mammogram ordered at last visit, patient has not followed up yet but she plans to.  She lost her Cologuard kit, so I have represcribed her new one.  Bilateral chronic knee pain Continues to have chronic bilateral knee pain, likely secondary to severe obesity.  She previously received steroid injections in both knees by orthopedic surgery, however her insurance changed recently and her previous orthopedic surgeon is no longer in network, so she would like to establish with a new orthopedic surgeon.  She is hoping to get knee surgery in the future as soon as she is able to lose some weight.  She feels like her pain is progressing and is limiting her mobility.  Ultimately, I feel weight loss is the most important factor in improving her symptoms long-term.  I have sent in a referral for her to establish with an orthopedic surgeon for knee injections.  In the meantime, I prescribed her a short course of ibuprofen, since she says that is the only medication that  has worked for her.  I have encouraged her to stagger it with Tylenol and to use Voltaren gel as well.  Given her kidney disease, it is preferable that she is not on NSAIDs long-term.  Trigger finger of right hand She reports trigger finger of the middle finger on the right hand which is intermittent, but has worsened recently.  It is not present on exam today.  I have recommended she purchase a finger splint and use it for the next few weeks, especially  while she is sleeping.  If her symptoms do not improve following this trial of immobilization, she may need treatment with injections.   Patient discussed with Dr. Carleene Overlie, MD [facility] Resident PGY-1 Clinic Phone: [phone] Pager: [phone]

## 2023-06-22 NOTE — Assessment & Plan Note (Addendum)
 History of hyperlipidemia.  The 10-year ASCVD risk score (Arnett DK, et al., 2019) is: 8%.  She is currently on atorvastatin 40 mg daily.  Will continue this medication. Spoke with her about dietary changes to reduce her cholesterol.

## 2023-06-22 NOTE — Assessment & Plan Note (Signed)
 Continues to have chronic bilateral knee pain, likely secondary to severe obesity.  She previously received steroid injections in both knees by orthopedic surgery, however her insurance changed recently and her previous orthopedic surgeon is no longer in network, so she would like to establish with a new orthopedic surgeon.  She is hoping to get knee surgery in the future as soon as she is able to lose some weight.  She feels like her pain is progressing and is limiting her mobility.  Ultimately, I feel weight loss is the most important factor in improving her symptoms long-term.  I have sent in a referral for her to establish with an orthopedic surgeon for knee injections.  In the meantime, I prescribed her a short course of ibuprofen, since she says that is the only medication that has worked for her.  I have encouraged her to stagger it with Tylenol and to use Voltaren gel as well.  Given her kidney disease, it is preferable that she is not on NSAIDs long-term.

## 2023-06-23 ENCOUNTER — Telehealth: Payer: Self-pay

## 2023-06-23 LAB — BMP8+ANION GAP
Anion Gap: 13 mmol/L (ref 10.0–18.0)
BUN/Creatinine Ratio: 12 (ref 12–28)
BUN: 13 mg/dL (ref 8–27)
CO2: 24 mmol/L (ref 20–29)
Calcium: 8.8 mg/dL (ref 8.7–10.3)
Chloride: 103 mmol/L (ref 96–106)
Creatinine, Ser: 1.1 mg/dL — ABNORMAL HIGH (ref 0.57–1.00)
Glucose: 101 mg/dL — ABNORMAL HIGH (ref 70–99)
Potassium: 4.4 mmol/L (ref 3.5–5.2)
Sodium: 140 mmol/L (ref 134–144)
eGFR: 58 mL/min/{1.73_m2} — ABNORMAL LOW (ref >59)

## 2023-06-23 NOTE — Telephone Encounter (Signed)
 Drug change request from the pharmacy for [facility] , per pharmacy patients plan does not cover the medication prescribed.

## 2023-06-24 ENCOUNTER — Encounter: Payer: Self-pay | Admitting: Student

## 2023-06-24 NOTE — Telephone Encounter (Addendum)
 Wegovy prescribed for obesity and weight loss. Patient chart notes both no insurance coverage and Medicaid potential. I have asked front desk for clarification, may be able to resubmit once Medicaid is approved.  Addendum 3/7 [employer] coverage per patient, insurance card does not work. Once her insurance is in place, we can discuss whether weight loss medications will be an option.

## 2023-07-01 ENCOUNTER — Telehealth: Payer: Self-pay | Admitting: *Deleted

## 2023-07-01 NOTE — Telephone Encounter (Signed)
 Patient to contact the breast center , changed insurance not sure of coverage. Will check back with patient,

## 2023-07-06 ENCOUNTER — Telehealth: Payer: Self-pay | Admitting: *Deleted

## 2023-07-06 NOTE — Telephone Encounter (Signed)
 Lvm for patient to return call regarding mammogram scheduling. Patient states last  that her insurance has changed. Waiting call back.

## 2023-07-27 ENCOUNTER — Telehealth: Payer: Self-pay | Admitting: *Deleted

## 2023-07-27 NOTE — Telephone Encounter (Signed)
 Returned call to patient regarding scheduling her mammogram due to her changing her insurance. Patient had call to let me know she had switched to [facility] . This the number of the insurance information she gave [id number]. Patient was instructed to call the 1-800 number to customer service to find out who is in network.

## 2023-07-30 ENCOUNTER — Other Ambulatory Visit: Payer: Self-pay

## 2023-08-11 ENCOUNTER — Other Ambulatory Visit (HOSPITAL_COMMUNITY): Payer: Self-pay

## 2023-09-17 ENCOUNTER — Telehealth: Payer: Self-pay | Admitting: *Deleted

## 2023-09-17 NOTE — Telephone Encounter (Signed)
 Called patient unable to leave message-phone is full/ per breast center they have reached out to patient with no call back.  Patient do not have any insurance coverage at this time. Unable to get this patient an appointment.

## 2023-10-06 ENCOUNTER — Other Ambulatory Visit: Payer: Self-pay

## 2023-10-15 ENCOUNTER — Other Ambulatory Visit (HOSPITAL_COMMUNITY): Payer: Self-pay

## 2023-10-28 ENCOUNTER — Telehealth: Payer: Self-pay | Admitting: *Deleted

## 2023-10-28 NOTE — Telephone Encounter (Signed)
 Unable to contact patient by phone / voice messages full. This call in regards to scheduling her mammogram.

## 2023-12-24 ENCOUNTER — Other Ambulatory Visit (HOSPITAL_COMMUNITY): Payer: Self-pay

## 2024-01-04 ENCOUNTER — Other Ambulatory Visit (HOSPITAL_COMMUNITY): Payer: Self-pay

## 2024-01-06 ENCOUNTER — Encounter: Payer: Self-pay | Admitting: Internal Medicine

## 2024-01-08 ENCOUNTER — Other Ambulatory Visit (HOSPITAL_COMMUNITY): Payer: Self-pay

## 2024-01-08 ENCOUNTER — Other Ambulatory Visit (HOSPITAL_BASED_OUTPATIENT_CLINIC_OR_DEPARTMENT_OTHER): Payer: Self-pay

## 2024-01-11 ENCOUNTER — Telehealth: Payer: Self-pay | Admitting: *Deleted

## 2024-01-11 NOTE — Telephone Encounter (Signed)
 Copied from CRM [id number]. Topic: General - Other >> Jan 07, 2024  2:51 PM Maria Greene SAILOR wrote: Reason for CRM: Patient needed pharmacy number provided [phone]

## 2024-02-22 ENCOUNTER — Encounter: Payer: Self-pay | Admitting: Radiology

## 2024-02-28 ENCOUNTER — Other Ambulatory Visit (HOSPITAL_COMMUNITY): Payer: Self-pay

## 2024-03-10 ENCOUNTER — Other Ambulatory Visit (HOSPITAL_COMMUNITY): Payer: Self-pay

## 2024-04-11 ENCOUNTER — Other Ambulatory Visit: Payer: Self-pay

## 2024-04-15 ENCOUNTER — Other Ambulatory Visit: Payer: Self-pay | Admitting: Student

## 2024-04-15 ENCOUNTER — Other Ambulatory Visit (HOSPITAL_COMMUNITY): Payer: Self-pay

## 2024-04-15 DIAGNOSIS — I1A0 Resistant hypertension: Secondary | ICD-10-CM

## 2024-04-15 DIAGNOSIS — I10 Essential (primary) hypertension: Secondary | ICD-10-CM

## 2024-04-18 ENCOUNTER — Other Ambulatory Visit (HOSPITAL_COMMUNITY): Payer: Self-pay

## 2024-04-18 MED ORDER — LOSARTAN POTASSIUM-HCTZ 50-12.5 MG PO TABS
1.0000 | ORAL_TABLET | Freq: Every day | ORAL | 3 refills | Status: AC
  Filled 2024-04-18: qty 90, 90d supply, fill #0

## 2024-04-18 MED ORDER — SPIRONOLACTONE 50 MG PO TABS
50.0000 mg | ORAL_TABLET | Freq: Every day | ORAL | 3 refills | Status: AC
  Filled 2024-04-18: qty 90, 90d supply, fill #0

## 2024-04-18 NOTE — Telephone Encounter (Signed)
 Patient last seen 06/22/23 I called the patient to schedule a appointment. I was unable to reach the patient. I lvm for her to give us  a call back.

## 2024-04-25 ENCOUNTER — Other Ambulatory Visit (HOSPITAL_COMMUNITY): Payer: Self-pay
